# Patient Record
Sex: Male | Born: 1975 | Race: Black or African American | Hispanic: No | Marital: Married | Smoking: Never smoker
Health system: Southern US, Community
[De-identification: ages and names within clinical notes are randomized; demographics above are authoritative.]

## PROBLEM LIST (undated history)

## (undated) DIAGNOSIS — D500 Iron deficiency anemia secondary to blood loss (chronic): Secondary | ICD-10-CM

## (undated) DIAGNOSIS — K5190 Ulcerative colitis, unspecified, without complications: Secondary | ICD-10-CM

## (undated) DIAGNOSIS — J45909 Unspecified asthma, uncomplicated: Secondary | ICD-10-CM

## (undated) DIAGNOSIS — K625 Hemorrhage of anus and rectum: Secondary | ICD-10-CM

## (undated) HISTORY — DX: Unspecified asthma, uncomplicated: J45.909

## (undated) HISTORY — DX: Iron deficiency anemia secondary to blood loss (chronic): D50.0

## (undated) HISTORY — DX: Ulcerative colitis, unspecified, without complications: K51.90

## (undated) HISTORY — DX: Hemorrhage of anus and rectum: K62.5

---

## 1998-10-23 ENCOUNTER — Ambulatory Visit (HOSPITAL_COMMUNITY): Admission: RE | Admit: 1998-10-23 | Discharge: 1998-10-23 | Payer: Self-pay | Admitting: *Deleted

## 2004-01-23 ENCOUNTER — Emergency Department (HOSPITAL_COMMUNITY): Admission: EM | Admit: 2004-01-23 | Discharge: 2004-01-23 | Payer: Self-pay | Admitting: Emergency Medicine

## 2004-06-12 ENCOUNTER — Emergency Department (HOSPITAL_COMMUNITY): Admission: EM | Admit: 2004-06-12 | Discharge: 2004-06-13 | Payer: Self-pay | Admitting: Emergency Medicine

## 2006-02-13 ENCOUNTER — Emergency Department (HOSPITAL_COMMUNITY): Admission: EM | Admit: 2006-02-13 | Discharge: 2006-02-13 | Payer: Self-pay | Admitting: Emergency Medicine

## 2008-06-01 ENCOUNTER — Emergency Department (HOSPITAL_COMMUNITY): Admission: EM | Admit: 2008-06-01 | Discharge: 2008-06-01 | Payer: Self-pay | Admitting: Family Medicine

## 2008-06-02 ENCOUNTER — Emergency Department (HOSPITAL_COMMUNITY): Admission: EM | Admit: 2008-06-02 | Discharge: 2008-06-02 | Payer: Self-pay | Admitting: *Deleted

## 2009-09-05 HISTORY — PX: CHOLECYSTECTOMY: SHX55

## 2010-03-04 ENCOUNTER — Emergency Department (HOSPITAL_COMMUNITY): Admission: EM | Admit: 2010-03-04 | Discharge: 2010-03-04 | Payer: Self-pay | Admitting: Family Medicine

## 2010-03-04 ENCOUNTER — Emergency Department (HOSPITAL_COMMUNITY): Admission: EM | Admit: 2010-03-04 | Discharge: 2010-03-04 | Payer: Self-pay | Admitting: Emergency Medicine

## 2010-11-21 LAB — BASIC METABOLIC PANEL WITH GFR
BUN: 13 mg/dL (ref 6–23)
Calcium: 9 mg/dL (ref 8.4–10.5)
Creatinine, Ser: 1.25 mg/dL (ref 0.4–1.5)
Glucose, Bld: 94 mg/dL (ref 70–99)
Sodium: 139 meq/L (ref 135–145)

## 2010-11-21 LAB — DIFFERENTIAL
Basophils Absolute: 0 10*3/uL (ref 0.0–0.1)
Basophils Relative: 0 % (ref 0–1)
Eosinophils Absolute: 0.2 K/uL (ref 0.0–0.7)
Eosinophils Relative: 2 % (ref 0–5)
Lymphocytes Relative: 30 % (ref 12–46)
Lymphs Abs: 2.4 K/uL (ref 0.7–4.0)
Monocytes Absolute: 0.7 K/uL (ref 0.1–1.0)
Monocytes Relative: 9 % (ref 3–12)
Neutro Abs: 4.7 K/uL (ref 1.7–7.7)
Neutrophils Relative %: 59 % (ref 43–77)

## 2010-11-21 LAB — BASIC METABOLIC PANEL
CO2: 29 mEq/L (ref 19–32)
Chloride: 105 mEq/L (ref 96–112)
GFR calc Af Amer: >60 mL/min (ref >60)
GFR calc non Af Amer: >60 mL/min (ref >60)
Potassium: 4.4 mEq/L (ref 3.5–5.1)

## 2010-11-21 LAB — CBC
HCT: 39.3 % (ref 39.0–52.0)
Hemoglobin: 13.3 g/dL (ref 13.0–17.0)
MCH: 32.8 pg (ref 26.0–34.0)
MCHC: 33.9 g/dL (ref 30.0–36.0)
MCV: 96.8 fL (ref 78.0–100.0)
Platelets: 194 K/uL (ref 150–400)
RBC: 4.06 MIL/uL — ABNORMAL LOW (ref 4.22–5.81)
RDW: 12.6 % (ref 11.5–15.5)
WBC: 7.9 K/uL (ref 4.0–10.5)

## 2011-01-22 IMAGING — CT CT HEAD W/O CM
1 series · 16 of 30 positions shown, 20 images · non-contrast
Comparison: None.

CLINICAL DATA: 34-year-old male with confusion, syncope,
hallucinations, pain behind the right eye.

CT HEAD WITHOUT CONTRAST
TECHNIQUE: Contiguous axial images were obtained from the base of
the skull through the vertex without contrast.

[Series 2: head routine 4.8 h37s · axial · 0.44mm/px · z∈[-72,+59]mm · 16 of 30 slices shown, 20 images]
[im 2/30  brain]
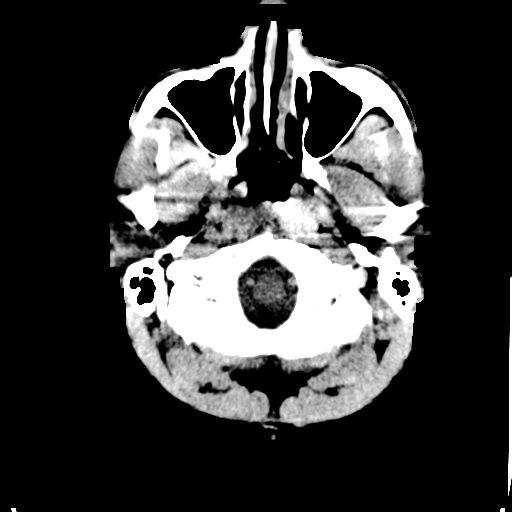
[im 2/30  bone]
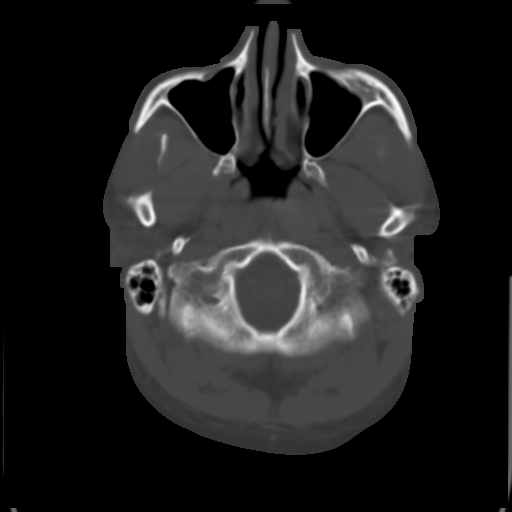
[im 4/30  brain]
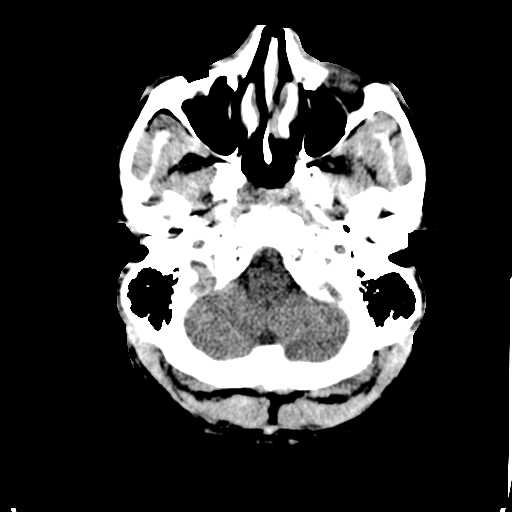
[im 6/30  brain]
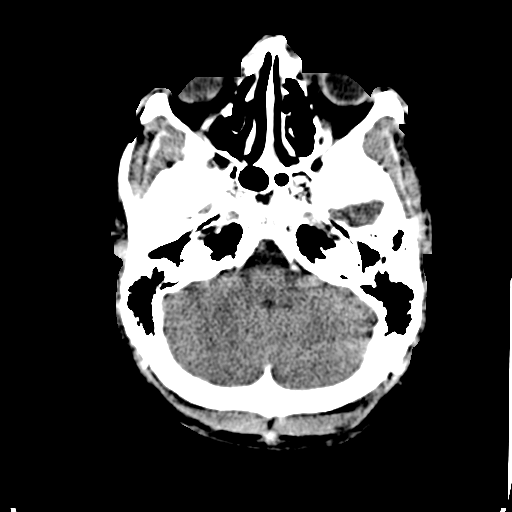
[im 8/30  brain]
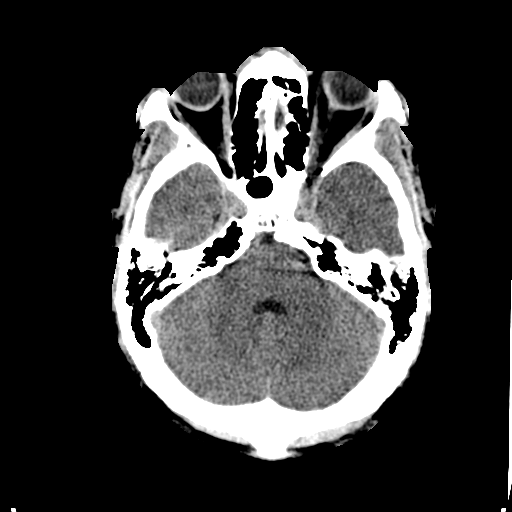
[im 9/30  brain]
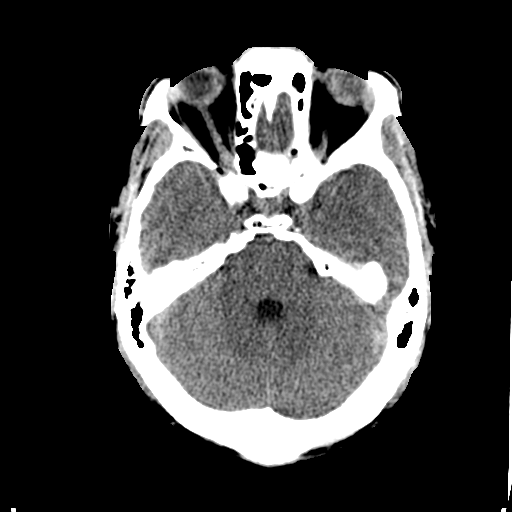
[im 9/30  bone]
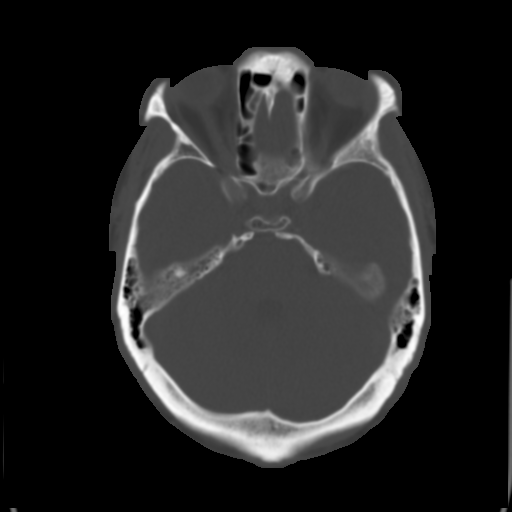
[im 11/30  brain]
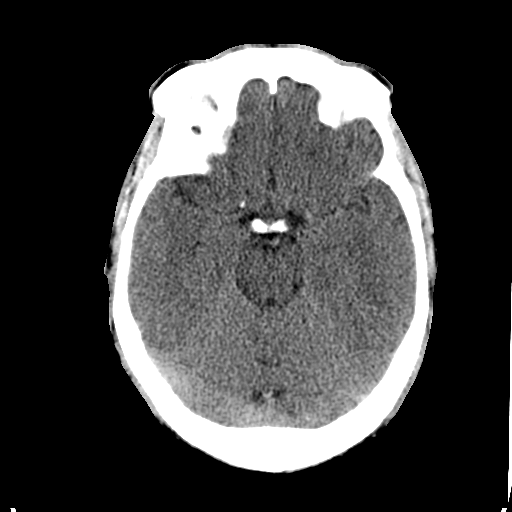
[im 13/30  brain]
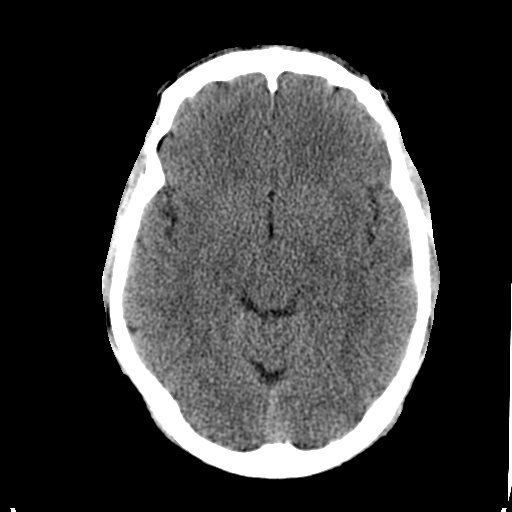
[im 15/30  brain]
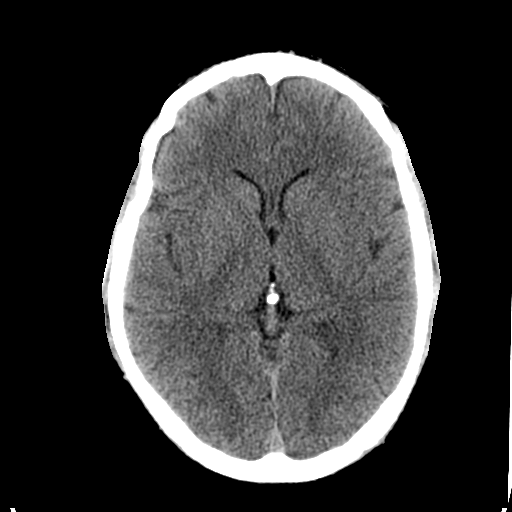
[im 16/30  brain]
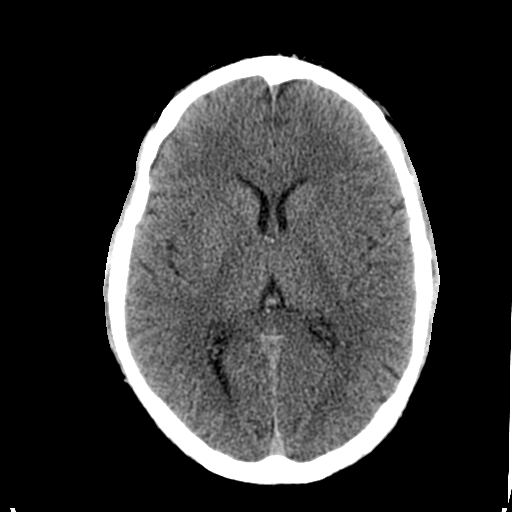
[im 16/30  bone]
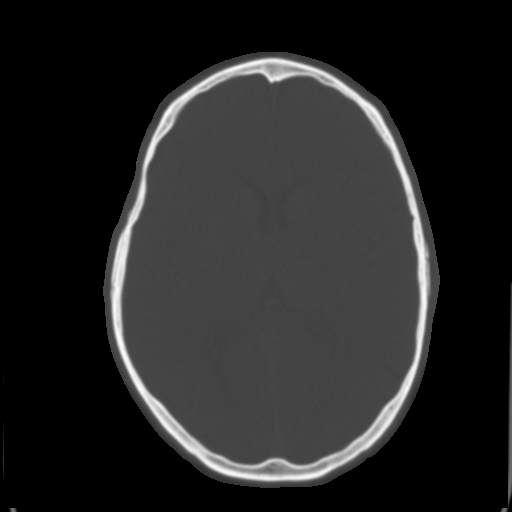
[im 18/30  brain]
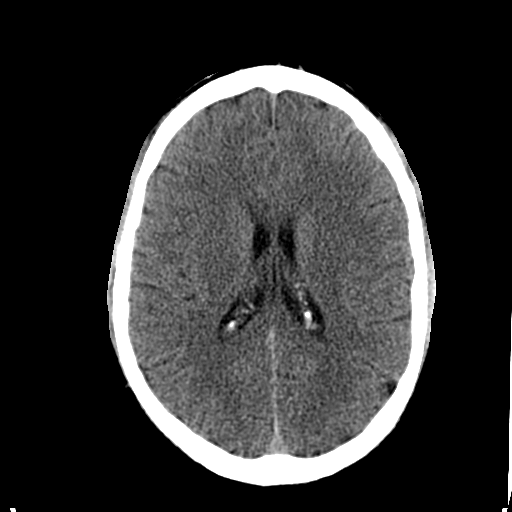
[im 20/30  brain]
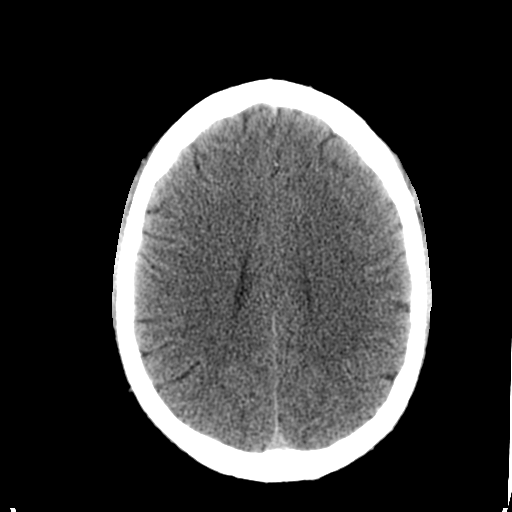
[im 22/30  brain]
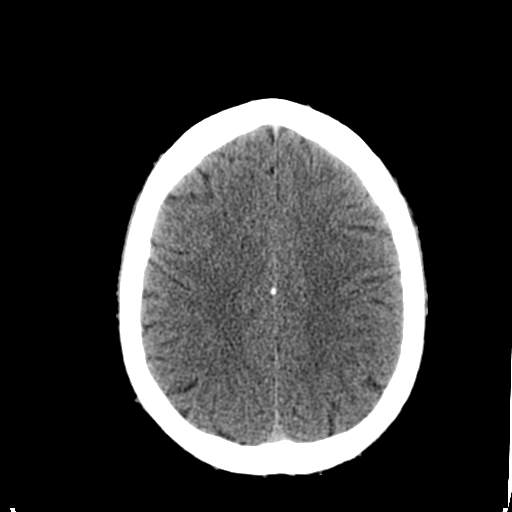
[im 23/30  brain]
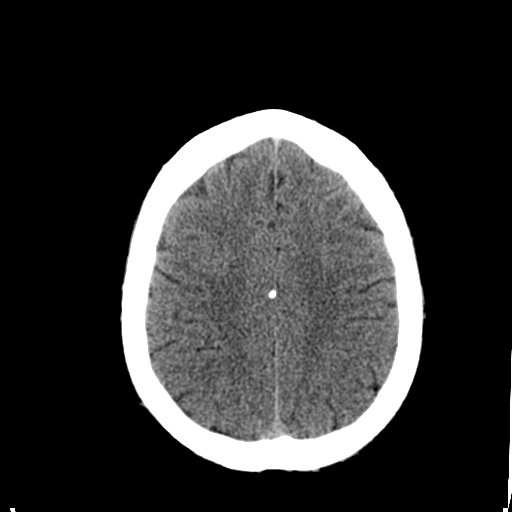
[im 23/30  bone]
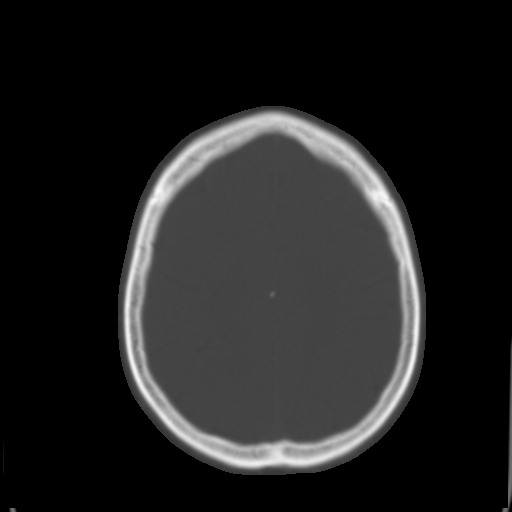
[im 25/30  brain]
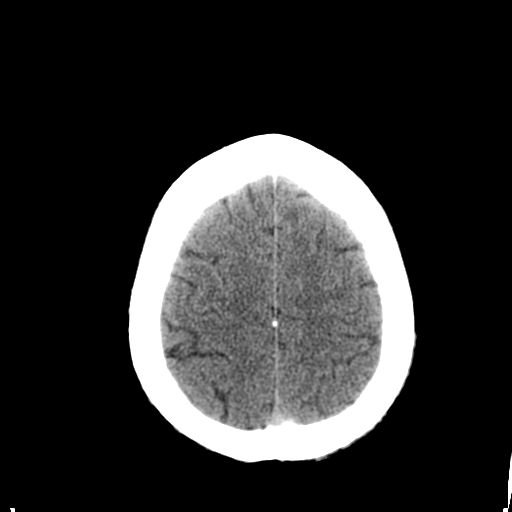
[im 27/30  brain]
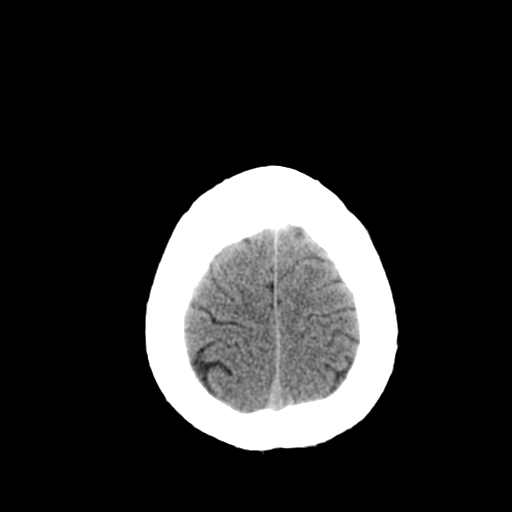
[im 29/30  brain]
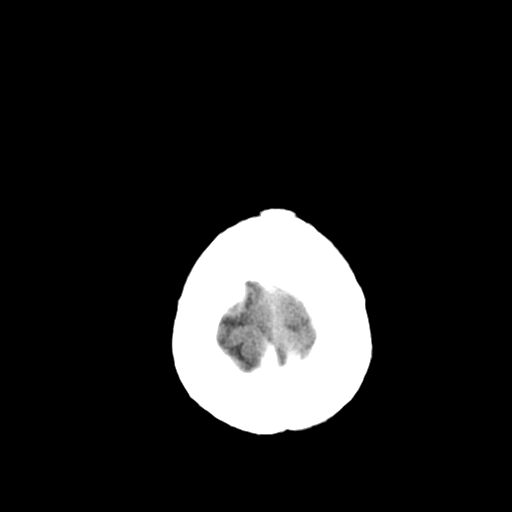

[16 of 30 positions shown; findings below may reference images not displayed]

FINDINGS: Visualized orbits and scalp soft tissues are within
normal limits.  Visualized paranasal sinuses and mastoids are
clear.  No acute osseous abnormality identified.

Cerebral volume is within normal limits for age.  No midline shift,
ventriculomegaly, mass effect, evidence of mass lesion,
intracranial hemorrhage or evidence of cortically based acute
infarction.  Gray-white matter differentiation is within normal
limits throughout the brain.  No suspicious intracranial vascular
hyperdensity.
IMPRESSION: Normal noncontrast CT appearance of the brain.

## 2011-06-06 LAB — DIFFERENTIAL
Eosinophils Absolute: 0.1
Eosinophils Relative: 1
Lymphocytes Relative: 13
Monocytes Absolute: 1.8 — ABNORMAL HIGH
Monocytes Relative: 10
Neutro Abs: 14 — ABNORMAL HIGH
Neutrophils Relative %: 77

## 2011-06-06 LAB — CBC
HCT: 42.9
Hemoglobin: 14.5
MCHC: 33.7
RDW: 12.6
WBC: 18.2 — ABNORMAL HIGH

## 2011-06-06 LAB — POCT I-STAT, CHEM 8
Calcium, Ion: 1.08 — ABNORMAL LOW
Creatinine, Ser: 1.7 — ABNORMAL HIGH
HCT: 44
Hemoglobin: 15

## 2011-06-06 LAB — POCT RAPID STREP A: Streptococcus, Group A Screen (Direct): POSITIVE — AB

## 2017-04-06 ENCOUNTER — Ambulatory Visit: Payer: Self-pay | Admitting: Adult Health

## 2017-04-27 ENCOUNTER — Ambulatory Visit: Payer: Self-pay | Admitting: Adult Health

## 2017-05-12 ENCOUNTER — Ambulatory Visit (INDEPENDENT_AMBULATORY_CARE_PROVIDER_SITE_OTHER): Payer: BC Managed Care – PPO | Admitting: Adult Health

## 2017-05-12 ENCOUNTER — Encounter: Payer: Self-pay | Admitting: Adult Health

## 2017-05-12 VITALS — BP 120/72 | Temp 98.5°F | Ht 69.0 in | Wt 201.0 lb

## 2017-05-12 DIAGNOSIS — Z23 Encounter for immunization: Secondary | ICD-10-CM | POA: Diagnosis not present

## 2017-05-12 DIAGNOSIS — Z125 Encounter for screening for malignant neoplasm of prostate: Secondary | ICD-10-CM

## 2017-05-12 DIAGNOSIS — Z0000 Encounter for general adult medical examination without abnormal findings: Secondary | ICD-10-CM | POA: Diagnosis not present

## 2017-05-12 LAB — HEPATIC FUNCTION PANEL
ALT: 14 U/L (ref 0–53)
AST: 14 U/L (ref 0–37)
Albumin: 3.9 g/dL (ref 3.5–5.2)
Alkaline Phosphatase: 120 U/L — ABNORMAL HIGH (ref 39–117)
BILIRUBIN TOTAL: 0.3 mg/dL (ref 0.2–1.2)
Bilirubin, Direct: 0.1 mg/dL (ref 0.0–0.3)
TOTAL PROTEIN: 7.8 g/dL (ref 6.0–8.3)

## 2017-05-12 LAB — BASIC METABOLIC PANEL
BUN: 17 mg/dL (ref 6–23)
CHLORIDE: 100 meq/L (ref 96–112)
CO2: 30 mEq/L (ref 19–32)
CREATININE: 1.18 mg/dL (ref 0.40–1.50)
Calcium: 8.9 mg/dL (ref 8.4–10.5)
GFR: 86.95 mL/min (ref >60.00)
Glucose, Bld: 87 mg/dL (ref 70–99)
POTASSIUM: 4.2 meq/L (ref 3.5–5.1)
SODIUM: 137 meq/L (ref 135–145)

## 2017-05-12 LAB — HEMOGLOBIN A1C: Hgb A1c MFr Bld: 5.8 % (ref 4.6–6.5)

## 2017-05-12 LAB — CBC WITH DIFFERENTIAL/PLATELET
BASOS PCT: 0.4 % (ref 0.0–3.0)
Basophils Absolute: 0 10*3/uL (ref 0.0–0.1)
EOS ABS: 0.2 10*3/uL (ref 0.0–0.7)
EOS PCT: 2.5 % (ref 0.0–5.0)
HEMATOCRIT: 38 % — AB (ref 39.0–52.0)
HEMOGLOBIN: 12.5 g/dL — AB (ref 13.0–17.0)
LYMPHS PCT: 36.4 % (ref 12.0–46.0)
Lymphs Abs: 2.4 10*3/uL (ref 0.7–4.0)
MCHC: 32.9 g/dL (ref 30.0–36.0)
MCV: 94.7 fl (ref 78.0–100.0)
MONOS PCT: 10.4 % (ref 3.0–12.0)
Monocytes Absolute: 0.7 10*3/uL (ref 0.1–1.0)
NEUTROS ABS: 3.3 10*3/uL (ref 1.4–7.7)
Neutrophils Relative %: 50.3 % (ref 43.0–77.0)
Platelets: 230 10*3/uL (ref 150.0–400.0)
RBC: 4.02 Mil/uL — ABNORMAL LOW (ref 4.22–5.81)
RDW: 13 % (ref 11.5–15.5)
WBC: 6.5 10*3/uL (ref 4.0–10.5)

## 2017-05-12 LAB — LIPID PANEL
CHOL/HDL RATIO: 4
CHOLESTEROL: 196 mg/dL (ref 0–200)
HDL: 48.1 mg/dL (ref >39.00)
LDL CALC: 138 mg/dL — AB (ref 0–99)
NonHDL: 147.62
Triglycerides: 49 mg/dL (ref 0.0–149.0)
VLDL: 9.8 mg/dL (ref 0.0–40.0)

## 2017-05-12 LAB — TSH: TSH: 0.96 u[IU]/mL (ref 0.35–4.50)

## 2017-05-12 LAB — PSA: PSA: 0.36 ng/mL (ref 0.10–4.00)

## 2017-05-12 NOTE — Progress Notes (Signed)
Patient presents to clinic today to establish care. He is a pleasant 41 year old male who  has a past medical history of Asthma.    Acute Concerns: Establish Care/CPE   Chronic Issues: None   Health Maintenance: Dental -- Routine Care Vision -- Not routine care  Immunizations -- Needs Tdap  Colonoscopy -- 2002 - for UC  Diet: " Not the best" He eats out a lot but not fast food.  Exercise: Does aerobic exercise twice per week   Past Medical History:  Diagnosis Date  . Asthma     Past Surgical History:  Procedure Laterality Date  . CHOLECYSTECTOMY  2011    No current outpatient prescriptions on file prior to visit.   No current facility-administered medications on file prior to visit.     No Known Allergies  Family History  Problem Relation Age of Onset  . Pancreatic cancer Mother   . Diabetes Mother   . Diabetes Maternal Grandmother   . Breast cancer Maternal Aunt     Social History   Social History  . Marital status: Single    Spouse name: N/A  . Number of children: N/A  . Years of education: N/A   Occupational History  . Not on file.   Social History Main Topics  . Smoking status: Never Smoker  . Smokeless tobacco: Never Used  . Alcohol use No  . Drug use: No  . Sexual activity: Not on file   Other Topics Concern  . Not on file   Social History Narrative  . No narrative on file    Review of Systems  Constitutional: Negative.   HENT: Negative.   Eyes: Negative.   Respiratory: Negative.   Cardiovascular: Negative.   Gastrointestinal: Negative.   Genitourinary: Negative.   Musculoskeletal: Negative.   Skin: Negative.   Neurological: Negative.   Endo/Heme/Allergies: Negative.   Psychiatric/Behavioral: Negative.   All other systems reviewed and are negative.   BP 120/72 (BP Location: Left Arm)   Temp 98.5 F (36.9 C) (Oral)   Ht  (1.753 m)   Wt 201 lb (91.2 kg)   BMI 29.68 kg/m   Physical Exam  Constitutional: He is  oriented to person, place, and time and well-developed, well-nourished, and in no distress. No distress.  HENT:  Head: Normocephalic and atraumatic.  Right Ear: External ear normal.  Left Ear: External ear normal.  Nose: Nose normal.  Mouth/Throat: Oropharynx is clear and moist. No oropharyngeal exudate.  Eyes: Pupils are equal, round, and reactive to light. Conjunctivae and EOM are normal. Right eye exhibits no discharge. Left eye exhibits no discharge. No scleral icterus.  Neck: Normal range of motion. Neck supple. No JVD present. No tracheal deviation present. No thyromegaly present.  Cardiovascular: Normal rate, regular rhythm, normal heart sounds and intact distal pulses.  Exam reveals no gallop and no friction rub.   No murmur heard. Pulmonary/Chest: Effort normal and breath sounds normal. No stridor. No respiratory distress. He has no wheezes. He has no rales. He exhibits no tenderness.  Abdominal: Soft. Bowel sounds are normal. He exhibits no distension and no mass. There is no tenderness. There is no rebound and no guarding.  Musculoskeletal: Normal range of motion. He exhibits no edema, tenderness or deformity.  Lymphadenopathy:    He has no cervical adenopathy.  Neurological: He is alert and oriented to person, place, and time. He displays normal reflexes. No cranial nerve deficit. He exhibits normal muscle tone. Coordination normal.  GCS score is 15.  Skin: Skin is warm and dry. No rash noted. He is not diaphoretic. No erythema. No pallor.  Psychiatric: Mood, memory, affect and judgment normal.  Nursing note and vitals reviewed.  Assessment/Plan: 1. Routine general medical examination at a health care facility - Flu and tdap given today  - Educated on the importance of diet and exercise.  - Follow up in one year or sooner if needed - Basic metabolic panel - CBC with Differential/Platelet - Hemoglobin A1c - Hepatic function panel - Lipid panel - PSA - TSH   Shirline Freesory Ashlyn Cabler,  NP

## 2017-05-12 NOTE — Patient Instructions (Signed)
It was great meeting you today   I will follow up with you about your blood work   Work on diet and increasing exercise to 3-4 times per week   Follow up in one year or sooner if needed  Health Maintenance, Male A healthy lifestyle and preventative care can promote health and wellness.  Maintain regular health, dental, and eye exams.  Eat a healthy diet. Foods like vegetables, fruits, whole grains, low-fat dairy products, and lean protein foods contain the nutrients you need and are low in calories. Decrease your intake of foods high in solid fats, added sugars, and salt. Get information about a proper diet from your health care provider, if necessary.  Regular physical exercise is one of the most important things you can do for your health. Most adults should get at least 150 minutes of moderate-intensity exercise (any activity that increases your heart rate and causes you to sweat) each week. In addition, most adults need muscle-strengthening exercises on 2 or more days a week.   Maintain a healthy weight. The body mass index (BMI) is a screening tool to identify possible weight problems. It provides an estimate of body fat based on height and weight. Your health care provider can find your BMI and can help you achieve or maintain a healthy weight. For males 20 years and older:  A BMI below 18.5 is considered underweight.  A BMI of 18.5 to 24.9 is normal.  A BMI of 25 to 29.9 is considered overweight.  A BMI of 30 and above is considered obese.  Maintain normal blood lipids and cholesterol by exercising and minimizing your intake of saturated fat. Eat a balanced diet with plenty of fruits and vegetables. Blood tests for lipids and cholesterol should begin at age 42 and be repeated every 5 years. If your lipid or cholesterol levels are high, you are over age 42, or you are at high risk for heart disease, you may need your cholesterol levels checked more frequently.Ongoing high lipid and  cholesterol levels should be treated with medicines if diet and exercise are not working.  If you smoke, find out from your health care provider how to quit. If you do not use tobacco, do not start.  Lung cancer screening is recommended for adults aged 55-80 years who are at high risk for developing lung cancer because of a history of smoking. A yearly low-dose CT scan of the lungs is recommended for people who have at least a 30-pack-year history of smoking and are current smokers or have quit within the past 15 years. A pack year of smoking is smoking an average of 1 pack of cigarettes a day for 1 year (for example, a 30-pack-year history of smoking could mean smoking 1 pack a day for 30 years or 2 packs a day for 15 years). Yearly screening should continue until the smoker has stopped smoking for at least 15 years. Yearly screening should be stopped for people who develop a health problem that would prevent them from having lung cancer treatment.  If you choose to drink alcohol, do not have more than 2 drinks per day. One drink is considered to be 12 oz (360 mL) of beer, 5 oz (150 mL) of wine, or 1.5 oz (45 mL) of liquor.  Avoid the use of street drugs. Do not share needles with anyone. Ask for help if you need support or instructions about stopping the use of drugs.  High blood pressure causes heart disease and increases  the risk of stroke. High blood pressure is more likely to develop in:  People who have blood pressure in the end of the normal range (100-139/85-89 mm Hg).  People who are overweight or obese.  People who are African American.  If you are 64-59 years of age, have your blood pressure checked every 3-5 years. If you are 80 years of age or older, have your blood pressure checked every year. You should have your blood pressure measured twice--once when you are at a hospital or clinic, and once when you are not at a hospital or clinic. Record the average of the two measurements. To  check your blood pressure when you are not at a hospital or clinic, you can use:  An automated blood pressure machine at a pharmacy.  A home blood pressure monitor.  If you are 81-56 years old, ask your health care provider if you should take aspirin to prevent heart disease.  Diabetes screening involves taking a blood sample to check your fasting blood sugar level. This should be done once every 3 years after age 38 if you are at a normal weight and without risk factors for diabetes. Testing should be considered at a younger age or be carried out more frequently if you are overweight and have at least 1 risk factor for diabetes.  Colorectal cancer can be detected and often prevented. Most routine colorectal cancer screening begins at the age of 38 and continues through age 57. However, your health care provider may recommend screening at an earlier age if you have risk factors for colon cancer. On a yearly basis, your health care provider may provide home test kits to check for hidden blood in the stool. A small camera at the end of a tube may be used to directly examine the colon (sigmoidoscopy or colonoscopy) to detect the earliest forms of colorectal cancer. Talk to your health care provider about this at age 24 when routine screening begins. A direct exam of the colon should be repeated every 5-10 years through age 106, unless early forms of precancerous polyps or small growths are found.  People who are at an increased risk for hepatitis B should be screened for this virus. You are considered at high risk for hepatitis B if:  You were born in a country where hepatitis B occurs often. Talk with your health care provider about which countries are considered high risk.  Your parents were born in a high-risk country and you have not received a shot to protect against hepatitis B (hepatitis B vaccine).  You have HIV or AIDS.  You use needles to inject street drugs.  You live with, or have sex  with, someone who has hepatitis B.  You are a man who has sex with other men (MSM).  You get hemodialysis treatment.  You take certain medicines for conditions like cancer, organ transplantation, and autoimmune conditions.  Hepatitis C blood testing is recommended for all people born from [birth year] through 1966 and any individual with known risk factors for hepatitis C.  Healthy men should no longer receive prostate-specific antigen (PSA) blood tests as part of routine cancer screening. Talk to your health care provider about prostate cancer screening.  Testicular cancer screening is not recommended for adolescents or adult males who have no symptoms. Screening includes self-exam, a health care provider exam, and other screening tests. Consult with your health care provider about any symptoms you have or any concerns you have about testicular cancer.  Practice  safe sex. Use condoms and avoid high-risk sexual practices to reduce the spread of sexually transmitted infections (STIs).  You should be screened for STIs, including gonorrhea and chlamydia if:  You are sexually active and are younger than 24 years.  You are older than 24 years, and your health care provider tells you that you are at risk for this type of infection.  Your sexual activity has changed since you were last screened, and you are at an increased risk for chlamydia or gonorrhea. Ask your health care provider if you are at risk.  If you are at risk of being infected with HIV, it is recommended that you take a prescription medicine daily to prevent HIV infection. This is called pre-exposure prophylaxis (PrEP). You are considered at risk if:  You are a man who has sex with other men (MSM).  You are a heterosexual man who is sexually active with multiple partners.  You take drugs by injection.  You are sexually active with a partner who has HIV.  Talk with your health care provider about whether you are at high risk of being  infected with HIV. If you choose to begin PrEP, you should first be tested for HIV. You should then be tested every 3 months for as long as you are taking PrEP.  Use sunscreen. Apply sunscreen liberally and repeatedly throughout the day. You should seek shade when your shadow is shorter than you. Protect yourself by wearing long sleeves, pants, a wide-brimmed hat, and sunglasses year round whenever you are outdoors.  Tell your health care provider of new moles or changes in moles, especially if there is a change in shape or color. Also, tell your health care provider if a mole is larger than the size of a pencil eraser.  A one-time screening for abdominal aortic aneurysm (AAA) and surgical repair of large AAAs by ultrasound is recommended for men aged 60-75 years who are current or former smokers.  Stay current with your vaccines (immunizations).   This information is not intended to replace advice given to you by your health care provider. Make sure you discuss any questions you have with your health care provider.   Document Released: 02/18/2008 Document Revised: 09/12/2014 Document Reviewed: 01/17/2011 Elsevier Interactive Patient Education [employer].

## 2017-05-12 NOTE — Addendum Note (Signed)
Addended by: Raj JanusADKINS, MISTY T on: 05/12/2017 08:41 AM   Modules accepted: Orders

## 2017-05-26 ENCOUNTER — Encounter: Payer: Self-pay | Admitting: Adult Health

## 2017-06-07 ENCOUNTER — Other Ambulatory Visit: Payer: Self-pay | Admitting: Adult Health

## 2017-06-07 DIAGNOSIS — K5120 Ulcerative (chronic) proctitis without complications: Secondary | ICD-10-CM

## 2017-08-09 ENCOUNTER — Encounter: Payer: Self-pay | Admitting: Adult Health

## 2021-02-17 ENCOUNTER — Other Ambulatory Visit: Payer: Self-pay

## 2021-02-17 ENCOUNTER — Encounter: Payer: Self-pay | Admitting: Gastroenterology

## 2021-02-17 ENCOUNTER — Ambulatory Visit (INDEPENDENT_AMBULATORY_CARE_PROVIDER_SITE_OTHER): Payer: BC Managed Care – PPO | Admitting: Gastroenterology

## 2021-02-17 VITALS — BP 138/83 | HR 83 | Temp 98.2°F | Ht 70.0 in | Wt 196.0 lb

## 2021-02-17 DIAGNOSIS — R194 Change in bowel habit: Secondary | ICD-10-CM | POA: Diagnosis not present

## 2021-02-17 DIAGNOSIS — K625 Hemorrhage of anus and rectum: Secondary | ICD-10-CM | POA: Diagnosis not present

## 2021-02-17 NOTE — Patient Instructions (Signed)
High-Fiber Eating Plan Fiber, also called dietary fiber, is a type of carbohydrate. It is found foods such as fruits, vegetables, whole grains, and beans. A high-fiber diet can have many health benefits. Your health care provider may recommend a high-fiber diet to help: Prevent constipation. Fiber can make your bowel movements more regular. Lower your cholesterol. Relieve the following conditions: Inflammation of veins in the anus (hemorrhoids). Inflammation of specific areas of the digestive tract (uncomplicated diverticulosis). A problem of the large intestine, also called the colon, that sometimes causes pain and diarrhea (irritable bowel syndrome, or IBS). Prevent overeating as part of a weight-loss plan. Prevent heart disease, type 2 diabetes, and certain cancers. What are tips for following this plan? Reading food labels  Check the nutrition facts label on food products for the amount of dietary fiber. Choose foods that have 5 grams of fiber or more per serving. The goals for recommended daily fiber intake include: Men (age 46 or younger): 34-38 g. Men (over age 47): 28-34 g. Women (age 12 or younger): 25-28 g. Women (over age 81): 22-25 g. Your daily fiber goal is _____________ g. Shopping Choose whole fruits and vegetables instead of processed forms, such as apple juice or applesauce. Choose a wide variety of high-fiber foods such as avocados, lentils, oats, and kidney beans. Read the nutrition facts label of the foods you choose. Be aware of foods with added fiber. These foods often have high sugar and sodium amounts per serving. Cooking Use whole-grain flour for baking and cooking. Cook with brown rice instead of white rice. Meal planning Start the day with a breakfast that is high in fiber, such as a cereal that contains 5 g of fiber or more per serving. Eat breads and cereals that are made with whole-grain flour instead of refined flour or white flour. Eat brown rice, bulgur  wheat, or millet instead of white rice. Use beans in place of meat in soups, salads, and pasta dishes. Be sure that half of the grains you eat each day are whole grains. General information You can get the recommended daily intake of dietary fiber by: Eating a variety of fruits, vegetables, grains, nuts, and beans. Taking a fiber supplement if you are not able to take in enough fiber in your diet. It is better to get fiber through food than from a supplement. Gradually increase how much fiber you consume. If you increase your intake of dietary fiber too quickly, you may have bloating, cramping, or gas. Drink plenty of water to help you digest fiber. Choose high-fiber snacks, such as berries, raw vegetables, nuts, and popcorn. What foods should I eat? Fruits Berries. Pears. Apples. Oranges. Avocado. Prunes and raisins. Dried figs. Vegetables Sweet potatoes. Spinach. Kale. Artichokes. Cabbage. Broccoli. Cauliflower.Green peas. Carrots. Squash. Grains Whole-grain breads. Multigrain cereal. Oats and oatmeal. Brown rice. Barley.Bulgur wheat. Moonshine. Quinoa. Bran muffins. Popcorn. Rye wafer crackers. Meats and other proteins Navy beans, kidney beans, and pinto beans. Soybeans. Split peas. Lentils. Nutsand seeds. Dairy Fiber-fortified yogurt. Beverages Fiber-fortified soy milk. Fiber-fortified orange juice. Other foods Fiber bars. The items listed above may not be a complete list of recommended foods and beverages. Contact a dietitian for more information. What foods should I avoid? Fruits Fruit juice. Cooked, strained fruit. Vegetables Fried potatoes. Canned vegetables. Well-cooked vegetables. Grains White bread. Pasta made with refined flour. White rice. Meats and other proteins Fatty cuts of meat. Fried chicken or fried fish. Dairy Milk. Yogurt. Cream cheese. Sour cream. Fats and oils Butters. Beverages  Soft drinks. Other foods Cakes and pastries. The items listed above may not  be a complete list of foods and beverages to avoid. Talk with your dietitian about what choices are best for you. Summary Fiber is a type of carbohydrate. It is found in foods such as fruits, vegetables, whole grains, and beans. A high-fiber diet has many benefits. It can help to prevent constipation, lower blood cholesterol, aid weight loss, and reduce your risk of heart disease, diabetes, and certain cancers. Increase your intake of fiber gradually. Increasing fiber too quickly may cause cramping, bloating, and gas. Drink plenty of water while you increase the amount of fiber you consume. The best sources of fiber include whole fruits and vegetables, whole grains, nuts, seeds, and beans. This information is not intended to replace advice given to you by your health care provider. Make sure you discuss any questions you have with your healthcare provider. Document Revised: 12/26/2019 Document Reviewed: 12/26/2019 Elsevier Patient Education  2022 Elsevier Inc.     RE: MyChart  Dear Mr. Isam Unrein makes it easy for you to view your health information - all in one secure location - from any computer or mobile device at any time. Use the activation code below to enroll in MyChart online at [email or web address]   Once your account is activated, you can:  View your test results. Communicate securely with your physician's office.  View your medical history, allergies, medications, and immunizations. Receive care virtually through an e-Visit.   If you are over 18, you may use features of MyChart to manage the health information of your spouse, children or others you care for.  Download child and adult access forms at [email or web address].    As you activate your MyChart account and need any technical assistance, please call the MyChart technical support line at [phone]) [phone]-CHART [phone]).  Be sure to also download the MyChart app for your mobile device.   Thank you for  choosing [facility] for your family's health care needs!   MyChart Activation Code:  Activation code not generated Current MyChart Status: Active             [facility]  [address] [place], [place] [address]

## 2021-02-17 NOTE — Progress Notes (Signed)
Ian Repress, MD [address]  [address]  [place], [place] [address]  Main: [phone]  Fax: [phone]    Gastroenterology Consultation  Referring Provider:     Shirline Frees, NP Primary Care Physician:  Ian Frees, NP Primary Gastroenterologist:  Dr. Arlyss Washington Reason for Consultation:     ?  Ulcerative colitis        HPI:   Ian Washington is a 45 y.o. male referred by Dr. Shirline Frees, NP  for consultation & management of approximately 3 months history of rectal bleeding associated with alternating episodes of constipation and diarrhea, urgency.  Patient reports that he was diagnosed with ulcerative colitis in [place] after he had a colonoscopy about 20 years ago.  Patient is not on any maintenance therapy for his ulcerative colitis.  He is from [place], [place], he reports that he underwent colonoscopy about 10 years ago and was reportedly normal.  He had a flareup of his GI symptoms recently for which he was given a steroid injection and a short course of prednisone which provided some relief.  He made an appointment to come here since he could not wait for 3 more months to see GI doctor in [place].  Patient works in a Programme researcher, broadcasting/film/video.  He does exercise daily.  He does not smoke tobacco.  Does drink alcohol occasionally.  No known history of anemia  NSAIDs: None  Antiplts/Anticoagulants/Anti thrombotics: None  GI Procedures: Colonoscopy over 10 years ago, reportedly normal, report not available Patient denies family history of GI malignancy, IBD  Past Medical History:  Diagnosis Date   Asthma    UC (ulcerative colitis) (HCC)      Current Outpatient Medications:    sildenafil (VIAGRA) 50 MG tablet, Take 50 mg by mouth daily as needed., Disp: , Rfl:     Family History  Problem Relation Age of Onset   Pancreatic cancer Mother    Diabetes Mother    Diabetes Maternal Grandmother    Breast cancer Maternal Aunt      Social History    Tobacco Use   Smoking status: Never   Smokeless tobacco: Never  Vaping Use   Vaping Use: Never used  Substance Use Topics   Alcohol use: No   Drug use: No    Allergies as of 02/17/2021   (No Known Allergies)    Review of Systems:    All systems reviewed and negative except where noted in HPI.   Physical Exam:  BP 138/83 (BP Location: Left Arm, Patient Position: Sitting, Cuff Size: Normal)   Pulse 83   Temp 98.2 F (36.8 C) (Oral)   Ht 5\' 10"  (1.778 m)   Wt 196 lb (88.9 kg)   BMI 28.12 kg/m  No LMP for male patient.  General:   Alert,  Well-developed, well-nourished, pleasant and cooperative in NAD Head:  Normocephalic and atraumatic. Eyes:  Sclera clear, no icterus.   Conjunctiva pink. Ears:  Normal auditory acuity. Nose:  No deformity, discharge, or lesions. Mouth:  No deformity or lesions,oropharynx pink & moist. Neck:  Supple; no masses or thyromegaly. Lungs:  Respirations even and unlabored.  Clear throughout to auscultation.   No wheezes, crackles, or rhonchi. No acute distress. Heart:  Regular rate and rhythm; no murmurs, clicks, rubs, or gallops. Abdomen:  Normal bowel sounds. Soft, non-tender and non-distended without masses, hepatosplenomegaly or hernias noted.  No guarding or rebound tenderness.   Rectal: Not performed Msk:  Symmetrical without gross  deformities. Good, equal movement & strength bilaterally. Pulses:  Normal pulses noted. Extremities:  No clubbing or edema.  No cyanosis. Neurologic:  Alert and oriented x3;  grossly normal neurologically. Skin:  Intact without significant lesions or rashes. No jaundice. Psych:  Alert and cooperative. Normal mood and affect.  Imaging Studies: No recent abdominal imaging  Assessment and Plan:   Ian Washington is a 45 y.o. pleasant African-American male with ?  Ulcerative colitis is referred for further evaluation of intermittent rectal bleeding, urgency as well as intermittent constipation  Recommend  flexible sigmoidoscopy Discussed about high-fiber diet, adequate intake of water MiraLAX as needed to have regular bowel movements Check CBC, CMP today  Follow up in 3 months   Ian Repress, MD

## 2021-02-18 ENCOUNTER — Encounter: Payer: Self-pay | Admitting: Gastroenterology

## 2021-02-18 LAB — COMPREHENSIVE METABOLIC PANEL
ALT: 11 IU/L (ref 0–44)
AST: 15 IU/L (ref 0–40)
Albumin/Globulin Ratio: 1.3 (ref 1.2–2.2)
Albumin: 4.2 g/dL (ref 4.0–5.0)
Alkaline Phosphatase: 125 IU/L — ABNORMAL HIGH (ref 44–121)
BUN/Creatinine Ratio: 12 (ref 9–20)
BUN: 14 mg/dL (ref 6–24)
Bilirubin Total: 0.6 mg/dL (ref 0.0–1.2)
CO2: 24 mmol/L (ref 20–29)
Calcium: 9 mg/dL (ref 8.7–10.2)
Chloride: 104 mmol/L (ref 96–106)
Creatinine, Ser: 1.18 mg/dL (ref 0.76–1.27)
Globulin, Total: 3.3 g/dL (ref 1.5–4.5)
Glucose: 79 mg/dL (ref 65–99)
Potassium: 4.2 mmol/L (ref 3.5–5.2)
Sodium: 143 mmol/L (ref 134–144)
Total Protein: 7.5 g/dL (ref 6.0–8.5)
eGFR: 77 mL/min/{1.73_m2} (ref >59)

## 2021-02-18 LAB — CBC
Hematocrit: 36.4 % — ABNORMAL LOW (ref 37.5–51.0)
Hemoglobin: 12.3 g/dL — ABNORMAL LOW (ref 13.0–17.7)
MCH: 30.9 pg (ref 26.6–33.0)
MCHC: 33.8 g/dL (ref 31.5–35.7)
MCV: 92 fL (ref 79–97)
Platelets: 230 10*3/uL (ref 150–450)
RBC: 3.98 x10E6/uL — ABNORMAL LOW (ref 4.14–5.80)
RDW: 12.2 % (ref 11.6–15.4)
WBC: 7.4 10*3/uL (ref 3.4–10.8)

## 2021-02-22 ENCOUNTER — Telehealth: Payer: Self-pay

## 2021-02-22 DIAGNOSIS — D649 Anemia, unspecified: Secondary | ICD-10-CM

## 2021-02-22 DIAGNOSIS — R748 Abnormal levels of other serum enzymes: Secondary | ICD-10-CM

## 2021-02-22 NOTE — Telephone Encounter (Signed)
Patient verbalized understanding of lab results. Patient states he will go get labs done in [place] where he lives

## 2021-02-22 NOTE — Telephone Encounter (Signed)
-----   Message from Toney Reil, MD sent at 02/19/2021  3:51 PM EDT ----- Labs revealed mild anemia, recommend iron panel, B12 and folate levels.  Has mildly elevated alkaline phosphatase levels, recommend alkaline phosphatase isoenzymes and GGT  RV

## 2021-02-24 ENCOUNTER — Telehealth: Payer: Self-pay

## 2021-02-24 MED ORDER — FUSION PLUS PO CAPS: 1.0000 | ORAL_CAPSULE | Freq: Every day | ORAL | 0 refills | Status: AC

## 2021-02-24 MED ORDER — NA SULFATE-K SULFATE-MG SULF 17.5-3.13-1.6 GM/177ML PO SOLN: 354.0000 mL | Freq: Once | ORAL | 0 refills | Status: DC

## 2021-02-24 NOTE — Telephone Encounter (Signed)
Patient verbalized understanding. He is okay with changing the procedure to a colonoscopy and EGD. Called Trish and informed her of the change. Sent instructions to mychart and pre to the pharmacy

## 2021-02-24 NOTE — Telephone Encounter (Signed)
-----   Message from Toney Reil, MD sent at 02/24/2021  9:16 AM EDT ----- Morrie Sheldon  Patient has severe iron deficiency anemia.  Instead of flexible sigmoidoscopy, I recommend upper endoscopy as well as colonoscopy given his history of ulcerative colitis and to identify any source of iron deficiency anemia.  If he is agreeable, please go ahead and schedule EGD and colonoscopy  Also, I recommend trial of fusion plus daily for 1 month  Rohini Vanga

## 2021-02-25 LAB — IRON,TIBC AND FERRITIN PANEL
Ferritin: 19 ng/mL — ABNORMAL LOW (ref 30–400)
Iron Saturation: 11 % — ABNORMAL LOW (ref 15–55)
Iron: 34 ug/dL — ABNORMAL LOW (ref 38–169)
Total Iron Binding Capacity: 310 ug/dL (ref 250–450)
UIBC: 276 ug/dL (ref 111–343)

## 2021-02-25 LAB — B12 AND FOLATE PANEL
Folate: 7.3 ng/mL (ref >3.0)
Vitamin B-12: 712 pg/mL (ref 232–1245)

## 2021-02-25 LAB — ALKALINE PHOSPHATASE, ISOENZYMES
Alkaline Phosphatase: 130 IU/L — ABNORMAL HIGH (ref 44–121)
BONE FRACTION: 40 % (ref 12–68)
INTESTINAL FRAC.: 11 % (ref 0–18)
LIVER FRACTION: 49 % (ref 13–88)

## 2021-02-25 LAB — GAMMA GT: GGT: 31 IU/L (ref 0–65)

## 2021-03-01 ENCOUNTER — Telehealth: Payer: Self-pay

## 2021-03-01 NOTE — Telephone Encounter (Signed)
Patient called and states he is going to be out of town for work on 03/15/2021. Moved procedure to 03/18/2021. Informed Trish of the change

## 2021-03-15 ENCOUNTER — Other Ambulatory Visit: Payer: Self-pay

## 2021-03-15 MED ORDER — NA SULFATE-K SULFATE-MG SULF 17.5-3.13-1.6 GM/177ML PO SOLN: 354.0000 mL | Freq: Once | ORAL | 0 refills | Status: AC

## 2021-03-15 NOTE — Progress Notes (Signed)
Patient would like to prep sent to [facility] in [place]

## 2021-03-17 ENCOUNTER — Encounter: Payer: Self-pay | Admitting: Gastroenterology

## 2021-03-18 ENCOUNTER — Ambulatory Visit
Admission: RE | Admit: 2021-03-18 | Discharge: 2021-03-18 | Disposition: A | Discharge status: Home / Self Care | Payer: 59 | Attending: Gastroenterology | Admitting: Gastroenterology

## 2021-03-18 ENCOUNTER — Other Ambulatory Visit: Payer: Self-pay

## 2021-03-18 ENCOUNTER — Encounter: Payer: Self-pay | Admitting: Gastroenterology

## 2021-03-18 ENCOUNTER — Telehealth: Payer: Self-pay

## 2021-03-18 ENCOUNTER — Ambulatory Visit: Payer: 59 | Admitting: Anesthesiology

## 2021-03-18 ENCOUNTER — Other Ambulatory Visit: Payer: Self-pay | Admitting: Gastroenterology

## 2021-03-18 ENCOUNTER — Encounter
Admission: RE | Disposition: A | Discharge status: Home / Self Care | Payer: Self-pay | Source: Home / Self Care | Attending: Gastroenterology

## 2021-03-18 DIAGNOSIS — D500 Iron deficiency anemia secondary to blood loss (chronic): Secondary | ICD-10-CM

## 2021-03-18 DIAGNOSIS — K2950 Unspecified chronic gastritis without bleeding: Secondary | ICD-10-CM | POA: Diagnosis not present

## 2021-03-18 DIAGNOSIS — D509 Iron deficiency anemia, unspecified: Secondary | ICD-10-CM | POA: Diagnosis not present

## 2021-03-18 DIAGNOSIS — K51011 Ulcerative (chronic) pancolitis with rectal bleeding: Secondary | ICD-10-CM

## 2021-03-18 DIAGNOSIS — R194 Change in bowel habit: Secondary | ICD-10-CM | POA: Diagnosis not present

## 2021-03-18 DIAGNOSIS — K921 Melena: Secondary | ICD-10-CM | POA: Insufficient documentation

## 2021-03-18 DIAGNOSIS — K625 Hemorrhage of anus and rectum: Secondary | ICD-10-CM | POA: Diagnosis not present

## 2021-03-18 DIAGNOSIS — K5100 Ulcerative (chronic) pancolitis without complications: Secondary | ICD-10-CM

## 2021-03-18 HISTORY — PX: ESOPHAGOGASTRODUODENOSCOPY (EGD) WITH PROPOFOL: SHX5813

## 2021-03-18 HISTORY — PX: COLONOSCOPY WITH PROPOFOL: SHX5780

## 2021-03-18 SURGERY — COLONOSCOPY WITH PROPOFOL
Anesthesia: General

## 2021-03-18 MED ORDER — PREDNISONE 10 MG PO TABS: ORAL_TABLET | ORAL | 0 refills | Status: DC

## 2021-03-18 MED ORDER — SODIUM CHLORIDE 0.9 % IV SOLN
INTRAVENOUS | Status: DC
Start: 2021-03-18 — End: 2021-03-18

## 2021-03-18 MED ORDER — PROPOFOL 500 MG/50ML IV EMUL
INTRAVENOUS | Status: DC | PRN
  Administered 2021-03-18: 100 ug/kg/min via INTRAVENOUS

## 2021-03-18 MED ORDER — MIDAZOLAM HCL 2 MG/2ML IJ SOLN
INTRAMUSCULAR | Status: AC
  Filled 2021-03-18: qty 2

## 2021-03-18 MED ORDER — PROPOFOL 10 MG/ML IV BOLUS
INTRAVENOUS | Status: DC | PRN
  Administered 2021-03-18: 50 mg via INTRAVENOUS

## 2021-03-18 MED ORDER — MIDAZOLAM HCL 2 MG/2ML IJ SOLN
INTRAMUSCULAR | Status: DC | PRN
  Administered 2021-03-18: 2 mg via INTRAVENOUS

## 2021-03-18 MED ORDER — PREDNISONE 10 MG PO TABS: ORAL_TABLET | ORAL | 0 refills | Status: AC

## 2021-03-18 MED ORDER — FENTANYL CITRATE (PF) 100 MCG/2ML IJ SOLN
INTRAMUSCULAR | Status: AC
  Filled 2021-03-18: qty 2

## 2021-03-18 MED ORDER — FENTANYL CITRATE (PF) 100 MCG/2ML IJ SOLN
INTRAMUSCULAR | Status: DC | PRN
  Administered 2021-03-18: 25 ug via INTRAVENOUS
  Administered 2021-03-18: 50 ug via INTRAVENOUS
  Administered 2021-03-18: 25 ug via INTRAVENOUS

## 2021-03-18 NOTE — Telephone Encounter (Signed)
My mistake, please redo the prescription  RV

## 2021-03-18 NOTE — Anesthesia Preprocedure Evaluation (Signed)
Anesthesia Evaluation  Patient identified by MRN, date of birth, ID band Patient awake    Reviewed: Allergy & Precautions, NPO status , Patient's Chart, lab work & pertinent test results  Airway Mallampati: II  TM Distance: >3 FB Neck ROM: Full    Dental no notable dental hx.    Pulmonary asthma ,    Pulmonary exam normal        Cardiovascular negative cardio ROS Normal cardiovascular exam     Neuro/Psych negative neurological ROS  negative psych ROS   GI/Hepatic Neg liver ROS, PUD, Bowel prep,Ulcerative Colitis   Endo/Other  negative endocrine ROS  Renal/GU negative Renal ROS  negative genitourinary   Musculoskeletal negative musculoskeletal ROS (+)   Abdominal   Peds negative pediatric ROS (+)  Hematology negative hematology ROS (+) anemia ,   Anesthesia Other Findings Rectal Bleed  Reproductive/Obstetrics negative OB ROS                            Anesthesia Physical Anesthesia Plan  ASA: 2  Anesthesia Plan: General   Post-op Pain Management:    Induction: Intravenous  PONV Risk Score and Plan: 2 and Propofol infusion and TIVA  Airway Management Planned: Natural Airway and Nasal Cannula  Additional Equipment:   Intra-op Plan:   Post-operative Plan:   Informed Consent: I have reviewed the patients History and Physical, chart, labs and discussed the procedure including the risks, benefits and alternatives for the proposed anesthesia with the patient or authorized representative who has indicated his/her understanding and acceptance.       Plan Discussed with: CRNA, Anesthesiologist and Surgeon  Anesthesia Plan Comments:         Anesthesia Quick Evaluation

## 2021-03-18 NOTE — Op Note (Signed)
[facility] Gastroenterology Patient Name: Ian Washington Procedure Date: 03/18/2021 8:46 AM MRN: [id number] Account #: [id number] Date of Birth: 1975/10/14 Admit Type: Outpatient Age: [age] Room[facility] ENDO ROOM 4 Gender: Male Note Status: Finalized Procedure:             Colonoscopy Indications:           Hematochezia, Unexplained iron deficiency anemia Providers:             Toney Reilohini Reddy Kaiser Belluomini MD, MD Referring MD:          No Local Md, MD (Referring MD) Medicines:             General Anesthesia Complications:         No immediate complications. Estimated blood loss:                         Minimal. Procedure:             Pre-Anesthesia Assessment:                        - Prior to the procedure, a History and Physical was                         performed, and patient medications and allergies were                         reviewed. The patient is competent. The risks and                         benefits of the procedure and the sedation options and                         risks were discussed with the patient. All questions                         were answered and informed consent was obtained.                         Patient identification and proposed procedure were                         verified by the physician, the nurse, the                         anesthesiologist, the anesthetist and the technician                         in the pre-procedure area in the procedure room in the                         endoscopy suite. Mental Status Examination: alert and                         oriented. Airway Examination: normal oropharyngeal                         airway and neck mobility. Respiratory Examination:  clear to auscultation. CV Examination: normal.                         Prophylactic Antibiotics: The patient does not require                         prophylactic antibiotics. Prior Anticoagulants: The                         patient  has taken no previous anticoagulant or                         antiplatelet agents. ASA Grade Assessment: II - A                         patient with mild systemic disease. After reviewing                         the risks and benefits, the patient was deemed in                         satisfactory condition to undergo the procedure. The                         anesthesia plan was to use general anesthesia.                         Immediately prior to administration of medications,                         the patient was re-assessed for adequacy to receive                         sedatives. The heart rate, respiratory rate, oxygen                         saturations, blood pressure, adequacy of pulmonary                         ventilation, and response to care were monitored                         throughout the procedure. The physical status of the                         patient was re-assessed after the procedure.                        After obtaining informed consent, the colonoscope was                         passed under direct vision. Throughout the procedure,                         the patient's blood pressure, pulse, and oxygen                         saturations were monitored continuously. The  Colonoscope was introduced through the anus and                         advanced to the the terminal ileum, with                         identification of the appendiceal orifice and IC                         valve. The colonoscopy was performed without                         difficulty. The patient tolerated the procedure well.                         The quality of the bowel preparation was evaluated                         using the BBPS [facility] Bowel Preparation Scale) with                         scores of: Right Colon = 3, Transverse Colon = 3 and                         Left Colon = 3 (entire mucosa seen well with no                         residual  staining, small fragments of stool or opaque                         liquid). The total BBPS score equals 9. Findings:      The perianal and digital rectal examinations were normal. Pertinent       negatives include normal sphincter tone and no palpable rectal lesions.      The terminal ileum appeared normal. Biopsies were taken with a cold       forceps for histology.      Inflammation was found in a continuous and circumferential pattern from       the rectum to the hepatic flexure. This was graded as Mayo Score 2       (moderate, with marked erythema, absent vascular pattern, friability,       erosions), and when compared to the previous examination, the findings       are worsened. Biopsies were taken with a cold forceps for histology.       Estimated blood loss was minimal. Impression:            - The examined portion of the ileum was normal.                         Biopsied.                        - Moderately active (Mayo Score 2) pancolitis                         ulcerative colitis, worsened since the last  examination. Biopsied. Recommendation:        - Discharge patient to home (with escort).                        - Resume previous diet today.                        - Continue present medications.                        - Await pathology results.                        - Return to my office as previously scheduled.                        - Use prednisone 40 mg PO once a day today. Procedure Code(s):     --- Professional ---                        [phone], Colonoscopy, flexible; with biopsy, single or                         multiple Diagnosis Code(s):     --- Professional ---                        K51.00, Ulcerative (chronic) pancolitis without                         complications                        K92.1, Melena (includes Hematochezia)                        D50.9, Iron deficiency anemia, unspecified CPT copyright 2019 American Medical  Association. All rights reserved. The codes documented in this report are preliminary and upon coder review may  be revised to meet current compliance requirements. Dr. Libby Maw Toney Reil MD, MD 03/18/2021 9:33:32 AM This report has been signed electronically. Number of Addenda: 0 Note Initiated On: 03/18/2021 8:46 AM Scope Withdrawal Time: 0 hours 13 minutes 31 seconds  Total Procedure Duration: 0 hours 18 minutes 4 seconds  Estimated Blood Loss:  Estimated blood loss was minimal.      [facility]

## 2021-03-18 NOTE — Telephone Encounter (Signed)
Patient wife called on the Prednisone because there is not enough pills for the instructions. These are the instructions for the prednisone that was given.   Take 4 tablets (40 mg total) by mouth daily for 14 days, THEN 3 tablets (30 mg total) daily for 7 days, THEN 2 tablets (20 mg total) daily for 7 days, THEN 1 tablet (10 mg total) daily for 7 days

## 2021-03-18 NOTE — Op Note (Signed)
[facility] Gastroenterology Patient Name: Ian Washington Procedure Date: 03/18/2021 8:46 AM MRN: [id number] Account #: [id number] Date of Birth: [birth year] Admit Type: Outpatient Age: 45 Room: [facility] ENDO ROOM 4 Gender: Male Note Status: Finalized Procedure:             Upper GI endoscopy Indications:           Unexplained iron deficiency anemia Providers:             Toney Reil MD, MD Referring MD:          No Local Md, MD (Referring MD) Medicines:             General Anesthesia Complications:         No immediate complications. Estimated blood loss: None. Procedure:             Pre-Anesthesia Assessment:                        - Prior to the procedure, a History and Physical was                         performed, and patient medications and allergies were                         reviewed. The patient is competent. The risks and                         benefits of the procedure and the sedation options and                         risks were discussed with the patient. All questions                         were answered and informed consent was obtained.                         Patient identification and proposed procedure were                         verified by the physician, the nurse, the                         anesthesiologist, the anesthetist and the technician                         in the pre-procedure area in the procedure room in the                         endoscopy suite. Mental Status Examination: alert and                         oriented. Airway Examination: normal oropharyngeal                         airway and neck mobility. Respiratory Examination:                         clear to auscultation. CV Examination: normal.  Prophylactic Antibiotics: The patient does not require                         prophylactic antibiotics. Prior Anticoagulants: The                         patient has taken no previous anticoagulant  or                         antiplatelet agents. ASA Grade Assessment: II - A                         patient with mild systemic disease. After reviewing                         the risks and benefits, the patient was deemed in                         satisfactory condition to undergo the procedure. The                         anesthesia plan was to use general anesthesia.                         Immediately prior to administration of medications,                         the patient was re-assessed for adequacy to receive                         sedatives. The heart rate, respiratory rate, oxygen                         saturations, blood pressure, adequacy of pulmonary                         ventilation, and response to care were monitored                         throughout the procedure. The physical status of the                         patient was re-assessed after the procedure.                        After obtaining informed consent, the endoscope was                         passed under direct vision. Throughout the procedure,                         the patient's blood pressure, pulse, and oxygen                         saturations were monitored continuously. The Endoscope                         was introduced through the mouth, and advanced to the  second part of duodenum. The upper GI endoscopy was                         accomplished without difficulty. The patient tolerated                         the procedure well. Findings:      The duodenal bulb and second portion of the duodenum were normal.      Patchy mildly erythematous mucosa was found in the gastric body and in       the gastric antrum. Biopsies were taken with a cold forceps for       Helicobacter pylori testing.      The cardia and gastric fundus were normal on retroflexion.      Esophagogastric landmarks were identified: the gastroesophageal junction       was found at 40 cm from the  incisors.      The gastroesophageal junction and examined esophagus were normal. Impression:            - Normal duodenal bulb and second portion of the                         duodenum.                        - Erythematous mucosa in the gastric body and antrum.                         Biopsied.                        - Esophagogastric landmarks identified.                        - Normal gastroesophageal junction and esophagus. Recommendation:        - Await pathology results.                        - Proceed with colonoscopy as scheduled                        See colonoscopy report Procedure Code(s):     --- Professional ---                        [phone], Esophagogastroduodenoscopy, flexible,                         transoral; with biopsy, single or multiple Diagnosis Code(s):     --- Professional ---                        K31.89, Other diseases of stomach and duodenum                        D50.9, Iron deficiency anemia, unspecified CPT copyright 2019 American Medical Association. All rights reserved. The codes documented in this report are preliminary and upon coder review may  be revised to meet current compliance requirements. Dr. Libby Maw Toney Reil MD, MD 03/18/2021 9:09:04 AM This report has been signed electronically. Number of Addenda: 0 Note Initiated On: 03/18/2021 8:46 AM Estimated Blood Loss:  Estimated  blood loss: none.      [facility]

## 2021-03-18 NOTE — Anesthesia Postprocedure Evaluation (Signed)
Anesthesia Post Note  Patient: Ian Washington  Procedure(s) Performed: COLONOSCOPY WITH PROPOFOL ESOPHAGOGASTRODUODENOSCOPY (EGD) WITH PROPOFOL  Patient location during evaluation: Phase II Anesthesia Type: General Level of consciousness: awake and alert, awake and oriented Pain management: pain level controlled Vital Signs Assessment: post-procedure vital signs reviewed and stable Respiratory status: spontaneous breathing, nonlabored ventilation and respiratory function stable Cardiovascular status: blood pressure returned to baseline and stable Postop Assessment: no apparent nausea or vomiting Anesthetic complications: no   No notable events documented.   Last Vitals:  Vitals:   03/18/21 0831 03/18/21 0933  BP: 138/78 (!) 105/59  Pulse: 100   Resp: 18   Temp: (!) 36 C (!) 36.1 C  SpO2: 98%     Last Pain:  Vitals:   03/18/21 0933  TempSrc:   PainSc: Asleep                 Manfred Arch

## 2021-03-18 NOTE — H&P (Signed)
  Arlyss Repress, MD [address]  [address]  [place], [place] [address]  Main: [phone]  Fax: [phone] Pager: [phone]  Primary Care Physician:  System, Provider Not In Primary Gastroenterologist:  Dr. Arlyss Repress  Pre-Procedure History & Physical: HPI:  Ian Washington is a 45 y.o. male is here for an endoscopy and colonoscopy.   Past Medical History:  Diagnosis Date   Asthma    UC (ulcerative colitis) [facility])     Past Surgical History:  Procedure Laterality Date   CHOLECYSTECTOMY  2011    Prior to Admission medications   Medication Sig Start Date End Date Taking? Authorizing Provider  Iron-FA-B Cmp-C-Biot-Probiotic (FUSION PLUS) CAPS Take 1 capsule by mouth daily. 02/24/21 03/26/21 Yes Eufelia Veno, Loel Dubonnet, MD  sildenafil (VIAGRA) 50 MG tablet Take 50 mg by mouth daily as needed. 01/13/21   [provider]    Allergies as of 02/17/2021   (No Known Allergies)    Family History  Problem Relation Age of Onset   Pancreatic cancer Mother    Diabetes Mother    Diabetes Maternal Grandmother    Breast cancer Maternal Aunt     Social History   Socioeconomic History   Marital status: Married    Spouse name: Not on file   Number of children: Not on file   Years of education: Not on file   Highest education level: Not on file  Occupational History   Not on file  Tobacco Use   Smoking status: Never   Smokeless tobacco: Never  Vaping Use   Vaping Use: Never used  Substance and Sexual Activity   Alcohol use: No   Drug use: No   Sexual activity: Not on file  Other Topics Concern   Not on file  Social History Narrative   Works as Education officer, environmental    Married    One child 3 y./o    Social Determinants of Corporate investment banker Strain: Not on file  Food Insecurity: Not on file  Transportation Needs: Not on file  Physical Activity: Not on file  Stress: Not on file  Social Connections: Not on file  Intimate Partner Violence: Not on file     Review of Systems: See HPI, otherwise negative ROS  Physical Exam: BP 138/78   Pulse 100   Temp (!) 96.8 F (36 C) (Temporal)   Resp 18   Ht 5' 9.5" (1.765 m)   Wt 88.5 kg   SpO2 98%   BMI 28.38 kg/m  General:   Alert,  pleasant and cooperative in NAD Head:  Normocephalic and atraumatic. Neck:  Supple; no masses or thyromegaly. Lungs:  Clear throughout to auscultation.    Heart:  Regular rate and rhythm. Abdomen:  Soft, nontender and nondistended. Normal bowel sounds, without guarding, and without rebound.   Neurologic:  Alert and  oriented x4;  grossly normal neurologically.  Impression/Plan: Ian Washington is here for an endoscopy and colonoscopy to be performed for IDA  Risks, benefits, limitations, and alternatives regarding  endoscopy and colonoscopy have been reviewed with the patient.  Questions have been answered.  All parties agreeable.   Lannette Donath, MD  03/18/2021, 8:44 AM

## 2021-03-18 NOTE — Addendum Note (Signed)
Addended by: Radene Knee L on: 03/18/2021 02:10 PM   Modules accepted: Orders

## 2021-03-18 NOTE — Transfer of Care (Signed)
Immediate Anesthesia Transfer of Care Note  Patient: Ian Washington  Procedure(s) Performed: COLONOSCOPY WITH PROPOFOL ESOPHAGOGASTRODUODENOSCOPY (EGD) WITH PROPOFOL  Patient Location: PACU  Anesthesia Type:General  Level of Consciousness: sedated and drowsy  Airway & Oxygen Therapy: Patient Spontanous Breathing  Post-op Assessment: Report given to RN, Post -op Vital signs reviewed and stable and Patient moving all extremities  Post vital signs: Reviewed and stable  Last Vitals:  Vitals Value Taken Time  BP 105/59 03/18/21 0935  Temp 36.1 C 03/18/21 0933  Pulse 105 03/18/21 0935  Resp 14 03/18/21 0935  SpO2 96 % 03/18/21 0935  Vitals shown include unvalidated device data.  Last Pain:  Vitals:   03/18/21 0933  TempSrc:   PainSc: Asleep         Complications: No notable events documented.

## 2021-03-18 NOTE — Telephone Encounter (Signed)
I have sent medication with the right number of pills to the pharmacy

## 2021-03-19 ENCOUNTER — Encounter: Payer: Self-pay | Admitting: Gastroenterology

## 2021-03-19 LAB — SURGICAL PATHOLOGY

## 2021-03-22 ENCOUNTER — Telehealth: Payer: Self-pay

## 2021-03-22 NOTE — Telephone Encounter (Signed)
Asked patient if they could come to the office or do a mychart visit for Wednesday 07/20/222 at 1:00pm. Patient states no he will be on a plain on Wednesday

## 2021-03-22 NOTE — Telephone Encounter (Signed)
Made patient  a appointment for 03/30/2021

## 2021-03-22 NOTE — Telephone Encounter (Signed)
-----   Message from Toney Reil, MD sent at 03/22/2021  9:41 AM EDT ----- Biopsies from colonoscopy do confirm ulcerative colitis. Please make a follow up appt to see me next week to discuss treatment options  RV

## 2021-03-24 ENCOUNTER — Encounter: Payer: Self-pay | Admitting: Gastroenterology

## 2021-03-24 LAB — QUANTIFERON-TB GOLD PLUS
QuantiFERON Mitogen Value: >10 IU/mL
QuantiFERON Nil Value: 0 IU/mL
QuantiFERON TB1 Ag Value: 0 IU/mL
QuantiFERON TB2 Ag Value: 0 IU/mL
QuantiFERON-TB Gold Plus: NEGATIVE

## 2021-03-24 LAB — HEPATITIS B SURFACE ANTIBODY,QUALITATIVE: Hep B Surface Ab, Qual: NONREACTIVE

## 2021-03-24 LAB — HEPATITIS A ANTIBODY, TOTAL: hep A Total Ab: NEGATIVE

## 2021-03-24 LAB — HEPATITIS B CORE ANTIBODY, TOTAL: Hep B Core Total Ab: NEGATIVE

## 2021-03-24 LAB — HEPATITIS C ANTIBODY: Hep C Virus Ab: 0.2 s/co ratio (ref 0.0–0.9)

## 2021-03-24 LAB — HEPATITIS B SURFACE ANTIGEN: Hepatitis B Surface Ag: NEGATIVE

## 2021-03-24 LAB — THIOPURINE METHYLTRANSFERASE (TPMT), RBC: TPMT Activity:: 17.7 Units/mL RBC

## 2021-03-26 LAB — CALPROTECTIN, FECAL: Calprotectin, Fecal: 920 ug/g — ABNORMAL HIGH (ref 0–120)

## 2021-03-30 ENCOUNTER — Encounter: Payer: Self-pay | Admitting: Gastroenterology

## 2021-03-30 ENCOUNTER — Telehealth (INDEPENDENT_AMBULATORY_CARE_PROVIDER_SITE_OTHER): Payer: 59 | Admitting: Gastroenterology

## 2021-03-30 DIAGNOSIS — K51011 Ulcerative (chronic) pancolitis with rectal bleeding: Secondary | ICD-10-CM

## 2021-03-30 DIAGNOSIS — D500 Iron deficiency anemia secondary to blood loss (chronic): Secondary | ICD-10-CM

## 2021-03-30 NOTE — Progress Notes (Signed)
Lannette Donath, MD [address]  [address]  [place], [place] [address]  Main: [phone]  Fax: [phone]    Gastroenterology Consultation Video Visit  Referring Provider:     No ref. provider found Primary Care Physician:  System, Provider Not In Primary Gastroenterologist:  Dr. Arlyss Repress Reason for Consultation: Ulcerative colitis        HPI:   Ian Washington is a 45 y.o. male referred by Dr. Cynda Familia, Provider Not In  for consultation & management of new diagnosis of ulcerative pancolitis  Virtual Visit Video Note  I connected with Margit Banda on 03/30/21 at 10:00 AM EDT by video and verified that I am speaking with the correct person using two identifiers.   I discussed the limitations, risks, security and privacy concerns of performing an evaluation and management service by video and the availability of in person appointments. I also discussed with the patient that there may be a patient responsible charge related to this service. The patient expressed understanding and agreed to proceed.  Location of the Patient: Home  Location of the provider: Home office  Persons participating in the visit: Patient and provider only   History of Present Illness: Ian Washington is a 45 year old pleasant African-American male who lives in [place], was originally seen by me on 02/17/21 with 3 months history of rectal bleeding associated with constipation and diarrhea, rectal urgency and abdominal cramps.  He carries a previous diagnosis of ulcerative colitis more than 20 years ago and was not on any treatment.  I repeated colonoscopy which revealed mild to moderate inflammation extending from hepatic flexure to rectum, and biopsies confirmed chronic active colitis.  His calprotectin levels were also elevated to 920.  He did have iron deficiency anemia.  I started him on short course of prednisone 40 mg daily.  He reports that his rectal bleeding has subsided.  He tends  to have more constipation for which he is taking Metamucil daily. This is a follow-up visit to discuss about pathology results and long-term treatment options  NSAIDs: None  Antiplts/Anticoagulants/Anti thrombotics: None  Patient denies family history of IBD, GI malignancy he does not smoke or drink alcohol  Patient denies any extraintestinal manifestations  GI Procedures:  EGD and colonoscopy 03/18/2021 - Normal duodenal bulb and second portion of the duodenum. - Erythematous mucosa in the gastric body and antrum. Biopsied. - Esophagogastric landmarks identified. - Normal gastroesophageal junction and esophagus.  - The examined portion of the ileum was normal. Biopsied. - Moderately active (Mayo Score 2) pancolitis ulcerative colitis, worsened since the last examination. Biopsied. DIAGNOSIS:  A. STOMACH; BIOPSY:  - MILD CHRONIC GASTRITIS.  - NEGATIVE FOR H. PYLORI, INTESTINAL METAPLASIA, DYSPLASIA AND  MALIGNANCY.   B. TERMINAL ILEUM, BIOPSY:  - NEGATIVE FOR ACTIVE ILEITIS, GRANULOMATA AND DYSPLASIA.   C. COLON, CECUM AND ASCENDING COLON; BIOPSIES:  - NEGATIVE FOR ACTIVE MUCOSAL INFLAMMATION, GRANULOMATA AND DYSPLASIA.   D. COLON, TRANSVERSE; BIOPSIES:  - ACTIVE MUCOSAL COLITIS.  - NEGATIVE FOR DYSPLASIA.   E. COLON, DESCENDING; BIOPSIES:  - CHRONIC ACTIVE MUCOSAL COLITIS CONSISTENT WITH IDIOPATHIC CHRONIC  INFLAMMATORY BOWEL DISEASE (FAVOR ULCERATIVE COLITIS).  - NEGATIVE FOR DYSPLASIA.   F. COLON, SIGMOID; BIOPSIES:  - CHRONIC ACTIVE MUCOSAL COLITIS CONSISTENT WITH IDIOPATHIC CHRONIC  INFLAMMATORY BOWEL DISEASE (FAVOR OF ULCERATIVE COLITIS).  - NEGATIVE FOR DYSPLASIA.   G.  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  - NEGATIVE FOR DYSPLASIA.   Past Medical History:  Diagnosis Date   Asthma    UC (ulcerative colitis) [facility])     Past Surgical History:   Procedure Laterality Date   CHOLECYSTECTOMY  2011   COLONOSCOPY WITH PROPOFOL N/A 03/18/2021   Procedure: COLONOSCOPY WITH PROPOFOL;  Surgeon: Toney Reil, MD;  [facility] ENDOSCOPY;  Service: Gastroenterology;  Laterality: N/A;   ESOPHAGOGASTRODUODENOSCOPY (EGD) WITH PROPOFOL N/A 03/18/2021   Procedure: ESOPHAGOGASTRODUODENOSCOPY (EGD) WITH PROPOFOL;  Surgeon: Toney Reil, MD;  Location[facility] ENDOSCOPY;  Service: Gastroenterology;  Laterality: N/A;    Current Outpatient Medications:    clobetasol ointment (TEMOVATE) 0.05 %, Apply topically., Disp: , Rfl:    ketoconazole (NIZORAL) 2 % shampoo, SMARTSIG:Topical 2-3 Times Weekly, Disp: , Rfl:    predniSONE (DELTASONE) 10 MG tablet, Take 4 tablets (40 mg total) by mouth daily with breakfast for 14 days, THEN 3 tablets (30 mg total) daily with breakfast for 7 days, THEN 2 tablets (20 mg total) daily with breakfast for 7 days, THEN 1 tablet (10 mg total) daily with breakfast for 7 days., Disp: 98 tablet, Rfl: 0   sildenafil (VIAGRA) 50 MG tablet, Take 50 mg by mouth daily as needed., Disp: , Rfl:   Family History  Problem Relation Age of Onset   Pancreatic cancer Mother    Diabetes Mother    Diabetes Maternal Grandmother    Breast cancer Maternal Aunt      Social History   Tobacco Use   Smoking status: Never   Smokeless tobacco: Never  Vaping Use   Vaping Use: Never used  Substance Use Topics   Alcohol use: No   Drug use: No    Allergies as of 03/30/2021   (No Known Allergies)     Imaging Studies: No abdominal imaging  Assessment and Plan:   Ian Washington is a 45 y.o. pleasant African-American male with new diagnosis of ulcerative pancolitis in 7/22, biopsy-proven  Ulcerative pancolitis Continue short course of prednisone, patient is currently finishing 2 weeks of 40 mg daily, will taper down by 10 mg weekly Given the extent and severity of disease, I recommend the patient to be started on biologic.   I have discussed with him regarding various classes of Biologics including anti-TNF, vedolizumab, Stelara and oral agents.  Patient preferred injectable medication Humira.  I have discussed the risks and benefits of anti-TNF therapy including but not limited to the risks of infection, injection site reaction, anaphylaxis, shingles, small risk of lymphoma, small risk of skin cancer.  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 Anemia: Iron deficiency anemia, continue fusion plus, recheck levels during next visit 3.Immunizations: Hep A and B serologies are negative, recommend Twinrix vaccine, Influenza recommend annually, prevnar recommend, pneumovax recommend, Varicella unknown, zoster recommend Shingrix vaccine 4.Cancer screening I) Colon cancer/dysplasia surveillance: No evidence of dysplasia, up-to-date II) Skin cancer - counseled about annual skin exam by dermatology and skin protection in summer using sun screen SPF > 50, clothing 5.Bone health Vitamin D status: Check during next visit Bone density testing: Not done, not needed at this time 5. Labs: Every 3 months 6. Smoking: Non-smoker 7. NSAIDs and Antibiotics use: n/a  Follow Up Instructions:   I discussed the assessment and treatment plan with the patient. The patient was provided an opportunity to ask questions and all were answered. The patient agreed with the plan and demonstrated an understanding of the instructions.   The patient was advised to call back or seek an in-person evaluation if the symptoms worsen or if the condition fails to improve as anticipated.  I provided 25 minutes of face-to-face time during this  encounter.   Follow up in 2 months   Arlyss Repress, MD

## 2021-04-01 ENCOUNTER — Encounter: Payer: Self-pay | Admitting: Gastroenterology

## 2021-04-06 ENCOUNTER — Telehealth: Payer: Self-pay

## 2021-04-06 NOTE — Telephone Encounter (Signed)
Emailed Armed forces training and education officer with [facility] to find out if the had the appeal letter for this patient so I can find out where to fax the appeal letter to. He email me back the fax number for the appeal letter which is Fax: [phone] Expedited / Urgent Fax: [phone]. Faxed letter to the urgent fax number and waiting on response from insurance company

## 2021-05-24 ENCOUNTER — Ambulatory Visit: Payer: BC Managed Care – PPO | Admitting: Gastroenterology

## 2021-05-25 ENCOUNTER — Ambulatory Visit: Payer: BC Managed Care – PPO | Admitting: Gastroenterology

## 2021-05-25 ENCOUNTER — Ambulatory Visit: Payer: 59 | Admitting: Gastroenterology

## 2021-07-06 ENCOUNTER — Encounter: Payer: Self-pay | Admitting: Gastroenterology

## 2021-07-07 ENCOUNTER — Encounter: Payer: Self-pay | Admitting: Gastroenterology

## 2021-07-07 ENCOUNTER — Telehealth (INDEPENDENT_AMBULATORY_CARE_PROVIDER_SITE_OTHER): Payer: 59 | Admitting: Gastroenterology

## 2021-07-07 ENCOUNTER — Ambulatory Visit: Payer: 59 | Admitting: Gastroenterology

## 2021-07-07 ENCOUNTER — Telehealth: Payer: Self-pay

## 2021-07-07 DIAGNOSIS — K51011 Ulcerative (chronic) pancolitis with rectal bleeding: Secondary | ICD-10-CM

## 2021-07-07 DIAGNOSIS — D500 Iron deficiency anemia secondary to blood loss (chronic): Secondary | ICD-10-CM | POA: Diagnosis not present

## 2021-07-07 NOTE — Progress Notes (Signed)
Sherri Sear, MD [address]  [place]  [place], [place] [address]  Main: [phone]  Fax: [phone]    Gastroenterology Consultation Video Visit  Referring Provider:     No ref. provider found Primary Care Physician:  System, Provider Not In Primary Gastroenterologist:  Dr. Cephas Darby Reason for Consultation: Ulcerative colitis        HPI:   Ian Washington is a 45 y.o. male referred by Dr. Estanislado Spire, Provider Not In  for consultation & management of new diagnosis of ulcerative pancolitis  Virtual Visit Video Note  I connected with Ian Washington on 07/07/21 at  1:45 PM EDT by video and verified that I am speaking with the correct person using two identifiers.   I discussed the limitations, risks, security and privacy concerns of performing an evaluation and management service by video and the availability of in person appointments. I also discussed with the patient that there may be a patient responsible charge related to this service. The patient expressed understanding and agreed to proceed.  Location of the Patient: Home  Location of the provider: Home office  Persons participating in the visit: Patient and provider only   History of Present Illness: Ian Washington is a 45 year old pleasant African-American male who lives in [place], was originally seen by me on 02/17/21 with 3 months history of rectal bleeding associated with constipation and diarrhea, rectal urgency and abdominal cramps.  He carries a previous diagnosis of ulcerative colitis more than 20 years ago and was not on any treatment.  I repeated colonoscopy which revealed mild to moderate inflammation extending from hepatic flexure to rectum, and biopsies confirmed chronic active colitis.  His calprotectin levels were also elevated to 920.  He did have iron deficiency anemia.  I started him on short course of prednisone 40 mg daily.  He reports that his rectal bleeding has subsided.  He tends  to have more constipation for which he is taking Metamucil daily. This is a follow-up visit to discuss about pathology results and long-term treatment options  Follow-up visit 11-22 Patient is doing well from colitis standpoint.  He finished prednisone course.  Currently in clinical remission.  He continues to take iron supplements daily.  He does not have any concerns today.  He is not aware if Humira is approved.  NSAIDs: None  Antiplts/Anticoagulants/Anti thrombotics: None  Patient denies family history of IBD, GI malignancy he does not smoke or drink alcohol  Patient denies any extraintestinal manifestations  GI Procedures:  EGD and colonoscopy 03/18/2021 - Normal duodenal bulb and second portion of the duodenum. - Erythematous mucosa in the gastric body and antrum. Biopsied. - Esophagogastric landmarks identified. - Normal gastroesophageal junction and esophagus.  - The examined portion of the ileum was normal. Biopsied. - Moderately active (Mayo Score 2) pancolitis ulcerative colitis, worsened since the last examination. Biopsied. DIAGNOSIS:  A. STOMACH; BIOPSY:  - MILD CHRONIC GASTRITIS.  - NEGATIVE FOR H. PYLORI, INTESTINAL METAPLASIA, DYSPLASIA AND  MALIGNANCY.   B. TERMINAL ILEUM, BIOPSY:  - NEGATIVE FOR ACTIVE ILEITIS, GRANULOMATA AND DYSPLASIA.   C. COLON, CECUM AND ASCENDING COLON; BIOPSIES:  - NEGATIVE FOR ACTIVE MUCOSAL INFLAMMATION, GRANULOMATA AND DYSPLASIA.   D. COLON, TRANSVERSE; BIOPSIES:  - ACTIVE MUCOSAL COLITIS.  - NEGATIVE FOR DYSPLASIA.   E. COLON, DESCENDING; BIOPSIES:  - CHRONIC ACTIVE MUCOSAL COLITIS CONSISTENT WITH IDIOPATHIC CHRONIC  INFLAMMATORY BOWEL DISEASE (FAVOR ULCERATIVE COLITIS).  - NEGATIVE FOR DYSPLASIA.   F. COLON,  SIGMOID; BIOPSIES:  - CHRONIC ACTIVE MUCOSAL COLITIS CONSISTENT WITH IDIOPATHIC CHRONIC  INFLAMMATORY BOWEL DISEASE (FAVOR OF ULCERATIVE COLITIS).  - NEGATIVE FOR DYSPLASIA.   G.  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  - NEGATIVE FOR DYSPLASIA.   Past Medical History:  Diagnosis Date   Asthma    UC (ulcerative colitis) [facility])     Past Surgical History:  Procedure Laterality Date   CHOLECYSTECTOMY  2011   COLONOSCOPY WITH PROPOFOL N/A 03/18/2021   Procedure: COLONOSCOPY WITH PROPOFOL;  Surgeon: Lin Landsman, MD;  Location[facility] ENDOSCOPY;  Service: Gastroenterology;  Laterality: N/A;   ESOPHAGOGASTRODUODENOSCOPY (EGD) WITH PROPOFOL N/A 03/18/2021   Procedure: ESOPHAGOGASTRODUODENOSCOPY (EGD) WITH PROPOFOL;  Surgeon: Lin Landsman, MD;  Location: [facility] ENDOSCOPY;  Service: Gastroenterology;  Laterality: N/A;    Current Outpatient Medications:    clobetasol ointment (TEMOVATE) 0.05 %, Apply topically., Disp: , Rfl:    ketoconazole (NIZORAL) 2 % shampoo, , Disp: , Rfl:    sildenafil (VIAGRA) 50 MG tablet, Take 50 mg by mouth daily as needed., Disp: , Rfl:    HUMIRA PEN 40 MG/0.4ML PNKT, SMARTSIG:40 Milligram(s) SUB-Q Every 2 Weeks (Patient not taking: Reported on 07/07/2021), Disp: , Rfl:   Family History  Problem Relation Age of Onset   Pancreatic cancer Mother    Diabetes Mother    Diabetes Maternal Grandmother    Breast cancer Maternal Aunt      Social History   Tobacco Use   Smoking status: Never   Smokeless tobacco: Never  Vaping Use   Vaping Use: Never used  Substance Use Topics   Alcohol use: No   Drug use: No    Allergies as of 07/07/2021   (No Known Allergies)     Imaging Studies: No abdominal imaging  Assessment and Plan:   Ian Washington is a 45 y.o. pleasant African-American male with new diagnosis of ulcerative pancolitis in 7/22, biopsy-proven  Ulcerative pancolitis S/p short course of prednisone 2 weeks of 40 mg daily, followed by quick taper. 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 Anemia: Iron deficiency anemia, continue fusion plus, recheck levels today 3.Immunizations: Hep A and B serologies are negative, received first dose of Twinrix vaccine, Influenza recommend annually, prevnar received, pneumovax recommend, Varicella unknown, received Shingrix vaccine first dose 4.Cancer screening I) Colon cancer/dysplasia surveillance: No evidence of dysplasia, up-to-date II) Skin cancer - counseled about annual skin exam by dermatology and skin protection in summer using sun screen SPF > 50, clothing 5.Bone health Vitamin D status: Check during next visit Bone density testing: Not done, not needed at this time 5. Labs: Every 3 months 6. Smoking: Non-smoker 7. NSAIDs and Antibiotics use: n/a    Follow Up Instructions:   I discussed the assessment and treatment plan with the patient. The patient was provided an opportunity to ask questions and all were answered. The patient agreed with the plan and demonstrated an understanding of the instructions.   The patient was advised to call back or seek an in-person evaluation if the symptoms worsen or if the condition fails to improve as anticipated.  I provided 25 minutes of face-to-face time during this encounter.   Follow up in 3-4 months   Arlyss Repress, MD

## 2021-07-07 NOTE — Telephone Encounter (Signed)
Called patient to reschedule appointment no answer

## 2021-07-07 NOTE — Addendum Note (Signed)
Addended by: Linward Foster on: 07/07/2021 03:24 PM   Modules accepted: Orders

## 2021-07-22 ENCOUNTER — Telehealth: Payer: Self-pay

## 2021-07-22 ENCOUNTER — Other Ambulatory Visit: Payer: Self-pay

## 2021-07-22 NOTE — Telephone Encounter (Signed)
Pt called returning your phone call  

## 2021-07-22 NOTE — Telephone Encounter (Signed)
CALLED PATIENT NO ANSWER LEFT VOICEMAIL FOR A CALL BACK ? ?

## 2021-07-22 NOTE — Progress Notes (Signed)
Made sure labs are released

## 2021-07-22 NOTE — Telephone Encounter (Signed)
Called patient gave him message from dr Allegra Lai he is going to get his medicine now and try to administer his self if he cant he will call us back to sign him up for the nurse program he is also going to get the labs done that were out in for him and will follow up in a month to see how things are doing and he will come in office in 4 months

## 2021-08-20 ENCOUNTER — Encounter: Payer: Self-pay | Admitting: Gastroenterology

## 2022-02-25 ENCOUNTER — Telehealth: Payer: Self-pay | Admitting: Gastroenterology

## 2022-03-01 ENCOUNTER — Telehealth: Payer: Self-pay | Admitting: Gastroenterology

## 2022-03-01 NOTE — Telephone Encounter (Signed)
Medical records were sent out on 06/26 to medical release

## 2022-03-05 ENCOUNTER — Encounter: Payer: Self-pay | Admitting: Gastroenterology

## 2022-05-07 ENCOUNTER — Other Ambulatory Visit: Payer: Self-pay | Admitting: Gastroenterology

## 2022-07-05 ENCOUNTER — Telehealth: Payer: Self-pay

## 2022-07-05 MED ORDER — HUMIRA (2 PEN) 40 MG/0.4ML ~~LOC~~ AJKT: AUTO-INJECTOR | SUBCUTANEOUS | 0 refills | Status: DC

## 2022-07-05 NOTE — Telephone Encounter (Signed)
Patient is calling because he needs a follow up appointment with Dr. Marius Ditch so he can get the Humira refilled to [facility]. Made appointment for 08/03/22 and refilled Humira till appointment

## 2022-07-11 ENCOUNTER — Telehealth: Payer: Self-pay

## 2022-07-11 NOTE — Telephone Encounter (Signed)
Insurance approved PA through 07/12/2023

## 2022-07-11 NOTE — Telephone Encounter (Signed)
[facility] is calling to see if we could do the PA over the phone for the [facility] on the phone. Informed represented that patient has not been seen in over a year. They took the information and fax over the last office visit note   Case Reference number [id number] reviewed in the next 4 days. Fax number is [phone]

## 2022-08-01 ENCOUNTER — Other Ambulatory Visit: Payer: Self-pay | Admitting: Gastroenterology

## 2022-08-03 ENCOUNTER — Ambulatory Visit: Payer: BC Managed Care – PPO | Admitting: Gastroenterology

## 2022-08-03 ENCOUNTER — Encounter: Payer: Self-pay | Admitting: Gastroenterology

## 2022-08-03 ENCOUNTER — Other Ambulatory Visit: Payer: Self-pay

## 2022-08-03 VITALS — BP 132/88 | HR 81 | Temp 98.2°F | Ht 69.5 in | Wt 200.4 lb

## 2022-08-03 DIAGNOSIS — K51011 Ulcerative (chronic) pancolitis with rectal bleeding: Secondary | ICD-10-CM | POA: Insufficient documentation

## 2022-08-03 DIAGNOSIS — D508 Other iron deficiency anemias: Secondary | ICD-10-CM | POA: Diagnosis not present

## 2022-08-03 DIAGNOSIS — K5100 Ulcerative (chronic) pancolitis without complications: Secondary | ICD-10-CM

## 2022-08-03 MED ORDER — SHINGRIX 50 MCG/0.5ML IM SUSR: 0.5000 mL | Freq: Once | INTRAMUSCULAR | 0 refills | Status: AC

## 2022-08-03 NOTE — Progress Notes (Signed)
Cephas Darby, MD [address]  [place]  [place], [place] [address]  Main: [phone]  Fax: [phone]    Gastroenterology Consultation  Referring Provider:     No ref. provider found Primary Care Physician:  System, Provider Not In Primary Gastroenterologist:  Dr. Cephas Darby Reason for Consultation: Ulcerative pancolitis        HPI:   Ian Washington is a 46 y.o. male is seen in consultation for 3 management of  Ulcerative pancolitis. Ian Washington is a 46 year old pleasant African-American male who lives in [place], was originally seen by me on 02/17/21 with 3 months history of rectal bleeding associated with constipation and diarrhea, rectal urgency and abdominal cramps. He carries a previous diagnosis of ulcerative colitis more than 20 years ago and was not on any treatment. I repeated colonoscopy which revealed mild to moderate inflammation extending from hepatic flexure to rectum, and biopsies confirmed chronic active colitis. His calprotectin levels were also elevated to 920. He did have iron deficiency anemia.  He received short course of prednisone 40 mg daily and responded well.  Subsequently, started on biweekly Humira monotherapy.  He has been doing very well from ulcerative colitis standpoint.  He reports having 1 formed bowel movement daily.  He denies any GI symptoms today.  Patient tells me that he received yellow fever vaccine before he traveled to [place] while he was on Humira.  He did not inform me about receiving yellow fever vaccine.  He does not smoke or drink alcohol  NSAIDs: None  Antiplts/Anticoagulants/Anti thrombotics: None  GI Procedures: Colonoscopy over 10 years ago, reportedly normal, report not available  Upper endoscopy and colonoscopy 03/18/2021 - Normal duodenal bulb and second portion of the duodenum. - Erythematous mucosa in the gastric body and antrum. Biopsied. - Esophagogastric landmarks identified. - Normal  gastroesophageal junction and esophagus.  - The examined portion of the ileum was normal. Biopsied. - Moderately active (Mayo Score 2) pancolitis ulcerative colitis, worsened since the last examination. Biopsied.  DIAGNOSIS: A. STOMACH; BIOPSY: - MILD CHRONIC GASTRITIS. - NEGATIVE FOR H. PYLORI, INTESTINAL METAPLASIA, DYSPLASIA AND MALIGNANCY.  B. TERMINAL ILEUM, BIOPSY: - NEGATIVE FOR ACTIVE ILEITIS, GRANULOMATA AND DYSPLASIA.  C. COLON, CECUM AND ASCENDING COLON; BIOPSIES: - NEGATIVE FOR ACTIVE MUCOSAL INFLAMMATION, GRANULOMATA AND DYSPLASIA.  D. COLON, TRANSVERSE; BIOPSIES: - ACTIVE MUCOSAL COLITIS. - NEGATIVE FOR DYSPLASIA.  E. 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 - NEGATIVE FOR DYSPLASIA.  F. 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 - NEGATIVE FOR DYSPLASIA.  G.  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 - NEGATIVE FOR DYSPLASIA.   Patient denies family history of GI malignancy, IBD  Past Medical History:  Diagnosis Date   Asthma    Rectal bleeding    UC (ulcerative colitis) ([place])      Current Outpatient Medications:    albuterol (VENTOLIN HFA) 108 (90 Base) MCG/ACT inhaler, Inhale into the lungs., Disp: , Rfl:    atorvastatin (LIPITOR) 10 MG tablet, Take 10 mg by mouth daily., Disp: , Rfl:    HUMIRA PEN 40 MG/0.4ML PNKT, INJECT 40MG  SUBCUTANEOUSLY EVERY OTHER WEEK, Disp: 2 each, Rfl: 0   sildenafil (VIAGRA) 50 MG tablet, Take 50 mg by mouth daily as needed., Disp: , Rfl:     Family  History  Problem Relation Age of Onset   Pancreatic cancer Mother    Diabetes Mother    Diabetes Maternal Grandmother    Breast cancer Maternal Aunt       Social History   Tobacco Use   Smoking status: Never   Smokeless tobacco: Never  Vaping Use   Vaping Use: Never used  Substance Use Topics   Alcohol use: No   Drug use: No    Allergies as of 08/03/2022 - Review Complete 08/03/2022  Allergen Reaction Noted   Acai extract Hives 02/25/2022    Review of Systems:    All systems reviewed and negative except where noted in HPI.   Physical Exam:  BP (!) 150/82 (BP Location: Left Arm, Patient Position: Sitting, Cuff Size: Normal)   Pulse 78   Temp 98.2 F (36.8 C) (Oral)   Ht 5' 9.5" (1.765 m)   Wt 200 lb 6 oz (90.9 kg)   BMI 29.17 kg/m  No LMP for male patient.  General:   Alert,  Well-developed, well-nourished, pleasant and cooperative in NAD Head:  Normocephalic and atraumatic. Eyes:  Sclera clear, no icterus.   Conjunctiva pink. Ears:  Normal auditory acuity. Nose:  No deformity, discharge, or lesions. Mouth:  No deformity or lesions,oropharynx pink & moist. Neck:  Supple; no masses or thyromegaly. Lungs:  Respirations even and unlabored.  Clear throughout to auscultation.   No wheezes, crackles, or rhonchi. No acute distress. Heart:  Regular rate and rhythm; no murmurs, clicks, rubs, or gallops. Abdomen:  Normal bowel sounds. Soft, non-tender and non-distended without masses, hepatosplenomegaly or hernias noted.  No guarding or rebound tenderness.   Rectal: Not performed Msk:  Symmetrical without gross deformities. Good, equal movement & strength bilaterally. Pulses:  Normal pulses noted. Extremities:  No clubbing or edema.  No cyanosis. Neurologic:  Alert and oriented x3;  grossly normal neurologically. Skin:  Intact without significant lesions or rashes. No jaundice. Psych:  Alert and cooperative. Normal mood and affect.  Imaging Studies: No recent abdominal imaging  Assessment and Plan:   Ian Washington is a 46 y.o. pleasant African-American male with PanUlcerative colitis on Humira biweekly monotherapy  initiated in November 2022 currently in clinical remission  Continue Humira monotherapy biweekly Check CBC, CMP, fecal calprotectin levels, vitamin D, iron panel Recommend colonoscopy to assess response to therapy and endoscopy healing  IBD Health Maintenance  1.TB status: QuantiFERON gold negative on 03/18/2021 2. Anemia: Mild iron deficiency anemia, received iron supplements, recheck CBC and iron panel today 3.Immunizations: Hep A and B, serologies were negative.  Patient reports that he received hepatitis A and B vaccines, Influenza up-to-date with annual influenza vaccine, prevnar received, pneumovax received, Varicella unknown, Zoster has not received Shingrix vaccine, sent in prescription to his local pharmacy.  Today, I have discussed with patient against administering live viral vaccines while he is on ongoing immunosuppressive therapy which is Humira.  He is planning another trip to [place] in August 2024 4.Cancer screening I) Colon cancer/dysplasia surveillance: Recommend colonoscopy II) Skin cancer - counseled about annual skin exam by dermatology and skin protection in summer using sun screen SPF > 50, clothing 5.Bone health Vitamin D status: Recheck levels Bone density testing: Not done 5. Labs: Check today 6. Smoking: Never smoked 7. NSAIDs and Antibiotics use: None   Follow up in 6 months   Arlyss Repress, MD

## 2022-08-04 LAB — COMPREHENSIVE METABOLIC PANEL
ALT: 16 IU/L (ref 0–44)
AST: 18 IU/L (ref 0–40)
Albumin/Globulin Ratio: 1 — ABNORMAL LOW (ref 1.2–2.2)
Albumin: 3.9 g/dL — ABNORMAL LOW (ref 4.1–5.1)
Alkaline Phosphatase: 148 IU/L — ABNORMAL HIGH (ref 44–121)
BUN/Creatinine Ratio: 12 (ref 9–20)
BUN: 14 mg/dL (ref 6–24)
Bilirubin Total: 0.3 mg/dL (ref 0.0–1.2)
CO2: 26 mmol/L (ref 20–29)
Calcium: 8.9 mg/dL (ref 8.7–10.2)
Chloride: 101 mmol/L (ref 96–106)
Creatinine, Ser: 1.16 mg/dL (ref 0.76–1.27)
Globulin, Total: 3.9 g/dL (ref 1.5–4.5)
Glucose: 63 mg/dL — ABNORMAL LOW (ref 70–99)
Potassium: 4.6 mmol/L (ref 3.5–5.2)
Sodium: 139 mmol/L (ref 134–144)
Total Protein: 7.8 g/dL (ref 6.0–8.5)
eGFR: 78 mL/min/{1.73_m2} (ref >59)

## 2022-08-04 LAB — IRON,TIBC AND FERRITIN PANEL
Ferritin: 70 ng/mL (ref 30–400)
Iron Saturation: 25 % (ref 15–55)
Iron: 71 ug/dL (ref 38–169)
Total Iron Binding Capacity: 285 ug/dL (ref 250–450)
UIBC: 214 ug/dL (ref 111–343)

## 2022-08-04 LAB — VITAMIN D 25 HYDROXY (VIT D DEFICIENCY, FRACTURES): Vit D, 25-Hydroxy: 22.8 ng/mL — ABNORMAL LOW (ref 30.0–100.0)

## 2022-08-04 LAB — CBC
Hematocrit: 38 % (ref 37.5–51.0)
Hemoglobin: 12.8 g/dL — ABNORMAL LOW (ref 13.0–17.7)
MCH: 31.8 pg (ref 26.6–33.0)
MCHC: 33.7 g/dL (ref 31.5–35.7)
MCV: 94 fL (ref 79–97)
Platelets: 234 10*3/uL (ref 150–450)
RBC: 4.03 x10E6/uL — ABNORMAL LOW (ref 4.14–5.80)
RDW: 12.5 % (ref 11.6–15.4)
WBC: 7.5 10*3/uL (ref 3.4–10.8)

## 2022-08-05 ENCOUNTER — Telehealth: Payer: Self-pay

## 2022-08-05 DIAGNOSIS — R7989 Other specified abnormal findings of blood chemistry: Secondary | ICD-10-CM

## 2022-08-05 MED ORDER — VITAMIN D (ERGOCALCIFEROL) 1.25 MG (50000 UNIT) PO CAPS: 50000.0000 [IU] | ORAL_CAPSULE | ORAL | 0 refills | Status: DC

## 2022-08-05 NOTE — Telephone Encounter (Signed)
-----   Message from Toney Reil, MD sent at 08/04/2022  6:50 PM EST ----- Please inform patient that he has mild vitamin D deficiency.  Recommend vitamin D 50 K once a week for 8 weeks.  He no longer has iron deficiency anemia.  Also, one of his liver enzymes continue to remain elevated and gradually uptrending.  Recommend to check right upper quadrant ultrasound and antimitochondrial antibodies.   RV

## 2022-08-05 NOTE — Telephone Encounter (Signed)
Sent medication to the pharmacy. Order lab work and ultrasound will call patient to find out when he can come for these

## 2022-08-05 NOTE — Telephone Encounter (Signed)
Patient verbalized understanding of results. He will go pick up the medication at the pharmacy. Asked if it would be easier for him to go to a place down in [place] he states no he wants to come here for it. Called and got patient schedule for 08/22/22 arrive to medical mall at 9:30am for a 10:00am scan. Nothing to eat or drink after midnight. Patient is going to the labs on the same day of his ultrasound

## 2022-08-11 ENCOUNTER — Ambulatory Visit: Payer: BC Managed Care – PPO | Admitting: Certified Registered"

## 2022-08-11 ENCOUNTER — Ambulatory Visit
Admission: RE | Admit: 2022-08-11 | Discharge: 2022-08-11 | Disposition: A | Discharge status: Home / Self Care | Payer: BC Managed Care – PPO | Source: Ambulatory Visit | Attending: Gastroenterology | Admitting: Gastroenterology

## 2022-08-11 ENCOUNTER — Encounter
Admission: RE | Disposition: A | Discharge status: Home / Self Care | Payer: Self-pay | Source: Ambulatory Visit | Attending: Gastroenterology

## 2022-08-11 ENCOUNTER — Encounter: Payer: Self-pay | Admitting: Gastroenterology

## 2022-08-11 DIAGNOSIS — K219 Gastro-esophageal reflux disease without esophagitis: Secondary | ICD-10-CM | POA: Insufficient documentation

## 2022-08-11 DIAGNOSIS — J45909 Unspecified asthma, uncomplicated: Secondary | ICD-10-CM | POA: Diagnosis not present

## 2022-08-11 DIAGNOSIS — K5100 Ulcerative (chronic) pancolitis without complications: Secondary | ICD-10-CM | POA: Diagnosis present

## 2022-08-11 DIAGNOSIS — K6389 Other specified diseases of intestine: Secondary | ICD-10-CM | POA: Diagnosis not present

## 2022-08-11 DIAGNOSIS — K51011 Ulcerative (chronic) pancolitis with rectal bleeding: Secondary | ICD-10-CM | POA: Insufficient documentation

## 2022-08-11 HISTORY — PX: COLONOSCOPY WITH PROPOFOL: SHX5780

## 2022-08-11 LAB — CALPROTECTIN, FECAL: Calprotectin, Fecal: 741 ug/g — ABNORMAL HIGH (ref 0–120)

## 2022-08-11 SURGERY — COLONOSCOPY WITH PROPOFOL
Anesthesia: General

## 2022-08-11 MED ORDER — LIDOCAINE HCL (CARDIAC) PF 100 MG/5ML IV SOSY
PREFILLED_SYRINGE | INTRAVENOUS | Status: DC | PRN
  Administered 2022-08-11: 50 mg via INTRAVENOUS

## 2022-08-11 MED ORDER — PROPOFOL 1000 MG/100ML IV EMUL
INTRAVENOUS | Status: AC
  Filled 2022-08-11: qty 100

## 2022-08-11 MED ORDER — PROPOFOL 500 MG/50ML IV EMUL
INTRAVENOUS | Status: DC | PRN
  Administered 2022-08-11: 150 ug/kg/min via INTRAVENOUS

## 2022-08-11 MED ORDER — SODIUM CHLORIDE 0.9 % IV SOLN: INTRAVENOUS | Status: DC

## 2022-08-11 MED ORDER — PROPOFOL 10 MG/ML IV BOLUS
INTRAVENOUS | Status: DC | PRN
  Administered 2022-08-11: 100 mg via INTRAVENOUS

## 2022-08-11 NOTE — Anesthesia Postprocedure Evaluation (Signed)
Anesthesia Post Note  Patient: ANCHOR DWAN  Procedure(s) Performed: COLONOSCOPY WITH PROPOFOL  Patient location during evaluation: Endoscopy Anesthesia Type: General Level of consciousness: awake and alert Pain management: pain level controlled Vital Signs Assessment: post-procedure vital signs reviewed and stable Respiratory status: spontaneous breathing, nonlabored ventilation, respiratory function stable and patient connected to nasal cannula oxygen Cardiovascular status: blood pressure returned to baseline and stable Postop Assessment: no apparent nausea or vomiting Anesthetic complications: no   No notable events documented.   Last Vitals:  Vitals:   08/11/22 1331 08/11/22 1458  BP: 127/87 123/68  Pulse: 65 85  Resp: 18 10  Temp: (!) 36.3 C (!) 36.1 C  SpO2: 100% 100%    Last Pain:  Vitals:   08/11/22 1458  TempSrc: Temporal  PainSc: Asleep                 Louie Boston

## 2022-08-11 NOTE — Anesthesia Procedure Notes (Signed)
Procedure Name: MAC Date/Time: 08/11/2022 2:34 PM  Performed by: Biagio Borg, CRNAPre-anesthesia Checklist: Patient identified, Emergency Drugs available, Suction available, Patient being monitored and Timeout performed Patient Re-evaluated:Patient Re-evaluated prior to induction Oxygen Delivery Method: Nasal cannula Induction Type: IV induction Placement Confirmation: positive ETCO2 and CO2 detector

## 2022-08-11 NOTE — Op Note (Signed)
[facility] Patient Name: Ian Washington Procedure Date: 08/11/2022 2:30 PM MRN: [id number] Account #: [id number] Date of Birth: 1976/05/24 Admit Type: Outpatient Age: 46 Room: [facility] ENDO ROOM 3 Gender: Male Note Status: Finalized Instrument Name: Colonoscope [id number] Procedure:             Colonoscopy Indications:           Last colonoscopy: July 2022, Disease activity                         assessment of chronic ulcerative pancolitis, Assess                         therapeutic response to therapy of chronic ulcerative                         pancolitis Providers:             Toney Reil MD, MD Referring MD:          No Local Md, MD (Referring MD) Medicines:             General Anesthesia Complications:         No immediate complications. Estimated blood loss: None. Procedure:             Pre-Anesthesia Assessment:                        - Prior to the procedure, a History and Physical was                         performed, and patient medications and allergies were                         reviewed. The patient is competent. The risks and                         benefits of the procedure and the sedation options and                         risks were discussed with the patient. All questions                         were answered and informed consent was obtained.                         Patient identification and proposed procedure were                         verified by the physician, the nurse, the                         anesthesiologist, the anesthetist and the technician                         in the pre-procedure area in the procedure room in the                         endoscopy suite. Mental Status Examination: alert and  oriented. Airway Examination: normal oropharyngeal                         airway and neck mobility. Respiratory Examination:                         clear to auscultation. CV Examination:  normal.                         Prophylactic Antibiotics: The patient does not require                         prophylactic antibiotics. Prior Anticoagulants: The                         patient has taken no anticoagulant or antiplatelet                         agents. ASA Grade Assessment: III - A patient with                         severe systemic disease. After reviewing the risks and                         benefits, the patient was deemed in satisfactory                         condition to undergo the procedure. The anesthesia                         plan was to use general anesthesia. Immediately prior                         to administration of medications, the patient was                         re-assessed for adequacy to receive sedatives. The                         heart rate, respiratory rate, oxygen saturations,                         blood pressure, adequacy of pulmonary ventilation, and                         response to care were monitored throughout the                         procedure. The physical status of the patient was                         re-assessed after the procedure.                        After obtaining informed consent, the colonoscope was                         passed under direct vision. Throughout the procedure,  the patient's blood pressure, pulse, and oxygen                         saturations were monitored continuously. The                         Colonoscope was introduced through the anus and                         advanced to the the terminal ileum, with                         identification of the appendiceal orifice and IC                         valve. The colonoscopy was performed without                         difficulty. The patient tolerated the procedure well.                         The quality of the bowel preparation was good. The                         terminal ileum, ileocecal valve, appendiceal  orifice,                         and rectum were photographed. Findings:      The perianal and digital rectal examinations were normal. Pertinent       negatives include normal sphincter tone and no palpable rectal lesions.      The terminal ileum appeared normal.      Normal mucosa was found in the sigmoid colon, in the transverse colon,       in the ascending colon and in the cecum. Biopsies were taken with a cold       forceps for histology. Estimated blood loss: none.      Inflammation was found in a continuous and circumferential pattern less       severe in the rectum compared to rectosigmoid and in the descending       colon. This was graded as Mayo Score 1 (mild, with erythema, decreased       vascular pattern, mild friability). Biopsies were taken with a cold       forceps for histology.      The retroflexed view of the distal rectum and anal verge was normal and       showed no anal or rectal abnormalities. Impression:            - The examined portion of the ileum was normal.                        - Normal mucosa in the sigmoid colon, in the                         transverse colon, in the ascending colon and in the                         cecum. Biopsied.                        -  Mild (Mayo Score 1) left-sided ulcerative colitis.                         Biopsied.                        - The distal rectum and anal verge are normal on                         retroflexion view. Recommendation:        - Discharge patient to home (with escort).                        - Resume previous diet today.                        - Continue present medications.                        - Await pathology results.                        - Return to my office as previously scheduled. Procedure Code(s):     --- Professional ---                        [phone], Colonoscopy, flexible; with biopsy, single or                         multiple Diagnosis Code(s):     --- Professional ---                         K51.50, Left sided colitis without complications                        K51.00, Ulcerative (chronic) pancolitis without                         complications CPT copyright 2022 American Medical Association. All rights reserved. The codes documented in this report are preliminary and upon coder review may  be revised to meet current compliance requirements. Dr. Libby Maw Toney Reil MD, MD 08/11/2022 2:59:05 PM This report has been signed electronically. Number of Addenda: 0 Note Initiated On: 08/11/2022 2:30 PM Scope Withdrawal Time: 0 hours 14 minutes 9 seconds  Total Procedure Duration: 0 hours 17 minutes 10 seconds  Estimated Blood Loss:  Estimated blood loss: none.      [facility]

## 2022-08-11 NOTE — Transfer of Care (Signed)
Immediate Anesthesia Transfer of Care Note  Patient: Ian Washington  Procedure(s) Performed: COLONOSCOPY WITH PROPOFOL  Patient Location: PACU and Endoscopy Unit  Anesthesia Type:General  Level of Consciousness: sedated  Airway & Oxygen Therapy: Patient Spontanous Breathing  Post-op Assessment: Report given to RN and Post -op Vital signs reviewed and stable  Post vital signs: Reviewed and stable  Last Vitals:  Vitals Value Taken Time  BP 123/68 08/11/22 1458  Temp 36.1 C 08/11/22 1458  Pulse 85 08/11/22 1458  Resp 10 08/11/22 1458  SpO2 100 % 08/11/22 1458    Last Pain:  Vitals:   08/11/22 1458  TempSrc: Temporal  PainSc: Asleep         Complications: No notable events documented.

## 2022-08-11 NOTE — Anesthesia Preprocedure Evaluation (Signed)
Anesthesia Evaluation  Patient identified by MRN, date of birth, ID band Patient awake    Reviewed: Allergy & Precautions, NPO status , Patient's Chart, lab work & pertinent test results  History of Anesthesia Complications Negative for: history of anesthetic complications  Airway Mallampati: III  TM Distance: >3 FB Neck ROM: full    Dental  (+) Chipped   Pulmonary neg shortness of breath, asthma    Pulmonary exam normal        Cardiovascular Exercise Tolerance: Good (-) Past MI negative cardio ROS Normal cardiovascular exam     Neuro/Psych negative neurological ROS  negative psych ROS   GI/Hepatic Neg liver ROS, PUD,GERD  Controlled,,  Endo/Other  negative endocrine ROS    Renal/GU negative Renal ROS  negative genitourinary   Musculoskeletal   Abdominal   Peds  Hematology negative hematology ROS (+)   Anesthesia Other Findings Past Medical History: No date: Asthma No date: Rectal bleeding No date: UC (ulcerative colitis) ([facility])  Past Surgical History: 2011: CHOLECYSTECTOMY 03/18/2021: COLONOSCOPY WITH PROPOFOL; N/A     Comment:  Procedure: COLONOSCOPY WITH PROPOFOL;  Surgeon: Toney Reil, MD;  Location: [facility] ENDOSCOPY;  Service:               Gastroenterology;  Laterality: N/A; 03/18/2021: ESOPHAGOGASTRODUODENOSCOPY (EGD) WITH PROPOFOL; N/A     Comment:  Procedure: ESOPHAGOGASTRODUODENOSCOPY (EGD) WITH               PROPOFOL;  Surgeon: Toney Reil, MD;  Location:               [facility] ENDOSCOPY;  Service: Gastroenterology;  Laterality:               N/A;  BMI    Body Mass Index: 28.35 kg/m      Reproductive/Obstetrics negative OB ROS                             Anesthesia Physical Anesthesia Plan  ASA: 3  Anesthesia Plan: General   Post-op Pain Management:    Induction: Intravenous  PONV Risk Score and Plan: Propofol infusion and  TIVA  Airway Management Planned: Natural Airway and Nasal Cannula  Additional Equipment:   Intra-op Plan:   Post-operative Plan:   Informed Consent: I have reviewed the patients History and Physical, chart, labs and discussed the procedure including the risks, benefits and alternatives for the proposed anesthesia with the patient or authorized representative who has indicated his/her understanding and acceptance.     Dental Advisory Given  Plan Discussed with: Anesthesiologist, CRNA and Surgeon  Anesthesia Plan Comments: (Patient consented for risks of anesthesia including but not limited to:  - adverse reactions to medications - risk of airway placement if required - damage to eyes, teeth, lips or other oral mucosa - nerve damage due to positioning  - sore throat or hoarseness - Damage to heart, brain, nerves, lungs, other parts of body or loss of life  Patient voiced understanding.)       Anesthesia Quick Evaluation

## 2022-08-11 NOTE — H&P (Signed)
Arlyss Repress, MD [address]  [address]  [place], [place] [address]  Main: [phone]  Fax: [phone] Pager: [phone]  Primary Care Physician:  Patient, No Pcp Per Primary Gastroenterologist:  Dr. Arlyss Repress  Pre-Procedure History & Physical: HPI:  Ian Washington is a 46 y.o. male is here for an colonoscopy.   Past Medical History:  Diagnosis Date   Asthma    Rectal bleeding    UC (ulcerative colitis) ([facility])     Past Surgical History:  Procedure Laterality Date   CHOLECYSTECTOMY  2011   COLONOSCOPY WITH PROPOFOL N/A 03/18/2021   Procedure: COLONOSCOPY WITH PROPOFOL;  Surgeon: Toney Reil, MD;  Location[facility] ENDOSCOPY;  Service: Gastroenterology;  Laterality: N/A;   ESOPHAGOGASTRODUODENOSCOPY (EGD) WITH PROPOFOL N/A 03/18/2021   Procedure: ESOPHAGOGASTRODUODENOSCOPY (EGD) WITH PROPOFOL;  Surgeon: Toney Reil, MD;  Location: [facility] ENDOSCOPY;  Service: Gastroenterology;  Laterality: N/A;    Prior to Admission medications   Medication Sig Start Date End Date Taking? Authorizing Provider  albuterol (VENTOLIN HFA) 108 (90 Base) MCG/ACT inhaler Inhale into the lungs. 07/18/22 08/17/22  [provider]  atorvastatin (LIPITOR) 10 MG tablet Take 10 mg by mouth daily.    [provider]  HUMIRA PEN 40 MG/0.4ML PNKT INJECT 40MG  SUBCUTANEOUSLY EVERY OTHER WEEK 08/01/22   Volanda Mangine, 08/03/22, MD  sildenafil (VIAGRA) 50 MG tablet Take 50 mg by mouth daily as needed. 01/13/21   [provider]  Vitamin D, Ergocalciferol, (DRISDOL) 1.25 MG (50000 UNIT) CAPS capsule Take 1 capsule (50,000 Units total) by mouth every 7 (seven) days. 08/05/22   14/1/23, MD    Allergies as of 08/03/2022 - Review Complete 08/03/2022  Allergen Reaction Noted   Acai extract Hives 02/25/2022    Family History  Problem Relation Age of Onset   Pancreatic cancer Mother    Diabetes Mother    Diabetes Maternal Grandmother    Breast cancer  Maternal Aunt     Social History   Socioeconomic History   Marital status: Married    Spouse name: Not on file   Number of children: Not on file   Years of education: Not on file   Highest education level: Not on file  Occupational History   Not on file  Tobacco Use   Smoking status: Never   Smokeless tobacco: Never  Vaping Use   Vaping Use: Never used  Substance and Sexual Activity   Alcohol use: No   Drug use: No   Sexual activity: Not on file  Other Topics Concern   Not on file  Social History Narrative   Works as 02/27/2022    Married    One child 3 y./o    Social Determinants of Education officer, environmental Strain: Not on file  Food Insecurity: Not on file  Transportation Needs: Not on file  Physical Activity: Not on file  Stress: Not on file  Social Connections: Not on file  Intimate Partner Violence: Not on file    Review of Systems: See HPI, otherwise negative ROS  Physical Exam: BP 127/87   Pulse 65   Temp (!) 97.4 F (36.3 C) (Temporal)   Resp 18   Ht 5\' 9"  (1.753 m)   Wt 87.1 kg   SpO2 100%   BMI 28.35 kg/m  General:   Alert,  pleasant and cooperative in NAD Head:  Normocephalic and atraumatic. Neck:  Supple; no masses or thyromegaly. Lungs:  Clear throughout to auscultation.  Heart:  Regular rate and rhythm. Abdomen:  Soft, nontender and nondistended. Normal bowel sounds, without guarding, and without rebound.   Neurologic:  Alert and  oriented x4;  grossly normal neurologically.  Impression/Plan: Ian Washington is here for an colonoscopy to be performed for Ulcerative pancolitis   Risks, benefits, limitations, and alternatives regarding  colonoscopy have been reviewed with the patient.  Questions have been answered.  All parties agreeable.   Lannette Donath, MD  08/11/2022, 2:02 PM

## 2022-08-12 ENCOUNTER — Encounter: Payer: Self-pay | Admitting: Gastroenterology

## 2022-08-15 LAB — SURGICAL PATHOLOGY

## 2022-08-16 ENCOUNTER — Encounter: Payer: Self-pay | Admitting: Gastroenterology

## 2022-08-16 ENCOUNTER — Telehealth: Payer: Self-pay

## 2022-08-16 DIAGNOSIS — K51011 Ulcerative (chronic) pancolitis with rectal bleeding: Secondary | ICD-10-CM

## 2022-08-16 MED ORDER — HUMIRA (2 PEN) 40 MG/0.4ML ~~LOC~~ AJKT: AUTO-INJECTOR | SUBCUTANEOUS | 2 refills | Status: DC

## 2022-08-16 NOTE — Telephone Encounter (Signed)
-----   Message from Toney Reil, MD sent at 08/16/2022 11:54 AM EST ----- Regarding: Increase Humira Morrie Sheldon  Please update his Humira prescription to weekly because of ongoing chronic active ulcerative colitis with elevated fecal calprotectin levels. Check adalimumab levels and antibodies. Patient agreed with above plan and please move up his appointment to 3 months, okay to [employer] with virtual visit  RV

## 2022-08-16 NOTE — Telephone Encounter (Signed)
Order labs and sent Humira to [facility] speciality. Made follow up appointment for 11/16/2022 at 3:30pm

## 2022-08-16 NOTE — Telephone Encounter (Signed)
Patient states next infusion is on Friday. Patient is going to try to come for lab work on the 08/18/2022. Told him when his follow up is and he states he will be out of [place] that day. Moved him to 11/20/2021. He states he needs to reschedule his ultrasound. Gave him the number to reschedule it

## 2022-08-22 ENCOUNTER — Ambulatory Visit: Admission: RE | Admit: 2022-08-22 | Payer: BC Managed Care – PPO | Source: Ambulatory Visit

## 2022-08-23 ENCOUNTER — Telehealth: Payer: Self-pay

## 2022-08-23 NOTE — Telephone Encounter (Signed)
Per Raiford Noble with [facility]    I am seeing patient is approved for dose escalation of weekly dosing. :)  We are calling him for shipment.  His last fill was 11/30.

## 2022-08-28 LAB — SERIAL MONITORING

## 2022-08-29 LAB — ADALIMUMAB+AB (SERIAL MONITOR)
Adalimumab Drug Level: 3.2 ug/mL
Anti-Adalimumab Antibody: <25 ng/mL

## 2022-08-29 LAB — MITOCHONDRIAL ANTIBODIES: Mitochondrial Ab: <20 Units (ref 0.0–20.0)

## 2022-10-27 ENCOUNTER — Telehealth: Payer: Self-pay

## 2022-10-27 NOTE — Telephone Encounter (Signed)
Received a medical records request for patient to fax [employer] processing all medical records for the past 3 years for patient. There is no medical release form sign from patient. Faxed back to [phone] requesting a medical release form sign and faxed back to [place]

## 2022-11-06 ENCOUNTER — Other Ambulatory Visit: Payer: Self-pay | Admitting: Gastroenterology

## 2022-11-16 ENCOUNTER — Ambulatory Visit: Payer: BC Managed Care – PPO | Admitting: Gastroenterology

## 2022-11-17 ENCOUNTER — Other Ambulatory Visit: Payer: Self-pay

## 2022-11-21 ENCOUNTER — Telehealth: Payer: Self-pay

## 2022-11-21 ENCOUNTER — Ambulatory Visit: Payer: BC Managed Care – PPO | Admitting: Gastroenterology

## 2022-11-21 NOTE — Telephone Encounter (Signed)
Due to illness, no show will not be charged.

## 2022-11-21 NOTE — Telephone Encounter (Signed)
Patient called because he states his wife got sick and he had to keep his son for her. He reschedule his appointment to 12/26/2022. Informed him it would be up to the office manager if a no show fee would be charged

## 2022-12-26 ENCOUNTER — Ambulatory Visit: Payer: BC Managed Care – PPO | Admitting: Gastroenterology

## 2022-12-26 ENCOUNTER — Encounter: Payer: Self-pay | Admitting: Gastroenterology

## 2022-12-26 VITALS — BP 138/82 | HR 72 | Temp 97.8°F | Ht 69.5 in | Wt 202.4 lb

## 2022-12-26 DIAGNOSIS — K5100 Ulcerative (chronic) pancolitis without complications: Secondary | ICD-10-CM | POA: Diagnosis not present

## 2022-12-26 DIAGNOSIS — K51011 Ulcerative (chronic) pancolitis with rectal bleeding: Secondary | ICD-10-CM

## 2022-12-26 NOTE — Progress Notes (Signed)
Arlyss Repress, MD [address]  [address]  [place], [place] [address]  Main: [phone]  Fax: [phone]    Gastroenterology Consultation  Referring Provider:     No ref. provider found Primary Care Physician:  Patient, No Pcp Per Primary Gastroenterologist:  Dr. Arlyss Repress Reason for Consultation: Ulcerative pancolitis        HPI:   Ian Washington is a 47 y.o. male is seen in consultation for 3 management of  Ulcerative pancolitis. Ian Washington is a 47 year old pleasant African-American male who lives in [place], was originally seen by me on 02/17/21 with 3 months history of rectal bleeding associated with constipation and diarrhea, rectal urgency and abdominal cramps. He carries a previous diagnosis of ulcerative colitis more than 20 years ago and was not on any treatment. I repeated colonoscopy which revealed mild to moderate inflammation extending from hepatic flexure to rectum, and biopsies confirmed chronic active colitis. His calprotectin levels were also elevated to 920. He did have iron deficiency anemia.  He received short course of prednisone 40 mg daily and responded well.  Subsequently, started on biweekly Humira monotherapy.  He has been doing very well from ulcerative colitis standpoint.  He reports having 1 formed bowel movement daily.  He denies any GI symptoms today.  Patient tells me that he received yellow fever vaccine before he traveled to [place] while he was on Humira.  He did not inform me about receiving yellow fever vaccine.  He does not smoke or drink alcohol  NSAIDs: None  Antiplts/Anticoagulants/Anti thrombotics: None  GI Procedures: Colonoscopy over 10 years ago, reportedly normal, report not available  Upper endoscopy and colonoscopy 03/18/2021 - Normal duodenal bulb and second portion of the duodenum. - Erythematous mucosa in the gastric body and antrum. Biopsied. - Esophagogastric landmarks identified. - Normal  gastroesophageal junction and esophagus.  - The examined portion of the ileum was normal. Biopsied. - Moderately active (Mayo Score 2) pancolitis ulcerative colitis, worsened since the last examination. Biopsied.  DIAGNOSIS: A. STOMACH; BIOPSY: - MILD CHRONIC GASTRITIS. - NEGATIVE FOR H. PYLORI, INTESTINAL METAPLASIA, DYSPLASIA AND MALIGNANCY.  B. TERMINAL ILEUM, BIOPSY: - NEGATIVE FOR ACTIVE ILEITIS, GRANULOMATA AND DYSPLASIA.  C. COLON, CECUM AND ASCENDING COLON; BIOPSIES: - NEGATIVE FOR ACTIVE MUCOSAL INFLAMMATION, GRANULOMATA AND DYSPLASIA.  D. COLON, TRANSVERSE; BIOPSIES: - ACTIVE MUCOSAL COLITIS. - NEGATIVE FOR DYSPLASIA.  E. 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 - NEGATIVE FOR DYSPLASIA.  F. 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 - NEGATIVE FOR DYSPLASIA.  G.  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 - NEGATIVE FOR DYSPLASIA.   Patient denies family history of GI malignancy, IBD  Past Medical History:  Diagnosis Date   Asthma    Rectal bleeding    UC (ulcerative colitis) (HCC)      Current Outpatient Medications:    Adalimumab (HUMIRA, 2 PEN,) 40 MG/0.4ML PNKT, INJECT 40MG  SUBCUTANEOUSLY  WEEKLY, Disp: 4 each, Rfl: 2   albuterol (VENTOLIN HFA) 108 (90 Base) MCG/ACT inhaler, Inhale into the lungs., Disp: , Rfl:    ammonium lactate (LAC-HYDRIN) 12 % lotion, Apply 1 Application topically daily., Disp: , Rfl:    atorvastatin (LIPITOR) 10 MG tablet, Take 10 mg by mouth daily., Disp: , Rfl:    betamethasone dipropionate (DIPROLENE)  0.05 % ointment, Apply topically., Disp: , Rfl:    clobetasol ointment (TEMOVATE) 0.05 %, Apply topically.,  Disp: , Rfl:    sildenafil (VIAGRA) 50 MG tablet, Take 50 mg by mouth daily as needed., Disp: , Rfl:    Vitamin D, Ergocalciferol, (DRISDOL) 1.25 MG (50000 UNIT) CAPS capsule, Take 1 capsule (50,000 Units total) by mouth every 7 (seven) days., Disp: 8 capsule, Rfl: 0    Family History  Problem Relation Age of Onset   Pancreatic cancer Mother    Diabetes Mother    Diabetes Maternal Grandmother    Breast cancer Maternal Aunt      Social History   Tobacco Use   Smoking status: Never   Smokeless tobacco: Never  Vaping Use   Vaping Use: Never used  Substance Use Topics   Alcohol use: No   Drug use: No    Allergies as of 12/26/2022 - Review Complete 12/26/2022  Allergen Reaction Noted   Acai extract Hives 02/25/2022    Review of Systems:    All systems reviewed and negative except where noted in HPI.   Physical Exam:  BP 138/82 (BP Location: Left Arm, Patient Position: Sitting, Cuff Size: Normal)   Pulse 72   Temp 97.8 F (36.6 C) (Oral)   Ht 5' 9.5" (1.765 m)   Wt 202 lb 6 oz (91.8 kg)   BMI 29.46 kg/m  No LMP for male patient.  General:   Alert,  Well-developed, well-nourished, pleasant and cooperative in NAD Head:  Normocephalic and atraumatic. Eyes:  Sclera clear, no icterus.   Conjunctiva pink. Ears:  Normal auditory acuity. Nose:  No deformity, discharge, or lesions. Mouth:  No deformity or lesions,oropharynx pink & moist. Neck:  Supple; no masses or thyromegaly. Lungs:  Respirations even and unlabored.  Clear throughout to auscultation.   No wheezes, crackles, or rhonchi. No acute distress. Heart:  Regular rate and rhythm; no murmurs, clicks, rubs, or gallops. Abdomen:  Normal bowel sounds. Soft, non-tender and non-distended without masses, hepatosplenomegaly or hernias noted.  No guarding or rebound tenderness.   Rectal: Not performed Msk:  Symmetrical without gross deformities. Good, equal movement & strength bilaterally. Pulses:  Normal pulses  noted. Extremities:  No clubbing or edema.  No cyanosis. Neurologic:  Alert and oriented x3;  grossly normal neurologically. Skin:  Intact without significant lesions or rashes. No jaundice. Psych:  Alert and cooperative. Normal mood and affect.  Imaging Studies: No recent abdominal imaging  Assessment and Plan:   Ian Washington is a 47 y.o. pleasant African-American male with PanUlcerative colitis on Humira biweekly monotherapy initiated in November 2022 currently in clinical remission  Continue Humira monotherapy biweekly Check CBC, CMP, fecal calprotectin levels, vitamin D, iron panel Recommend colonoscopy to assess response to therapy and endoscopy healing  IBD Health Maintenance  1.TB status: QuantiFERON gold negative on 03/18/2021 2. Anemia: Mild iron deficiency anemia, received iron supplements, recheck CBC and iron panel today 3.Immunizations: Hep A and B, serologies were negative.  Patient reports that he received hepatitis A and B vaccines, Influenza up-to-date with annual influenza vaccine, prevnar received, pneumovax received, Varicella unknown, Zoster has not received Shingrix vaccine, sent in prescription to his local pharmacy.  Today, I have discussed with patient against administering live viral vaccines while he is on ongoing immunosuppressive therapy which is Humira.  He is planning another trip to [place] in August 2024 4.Cancer screening I) Colon cancer/dysplasia surveillance: Recommend colonoscopy II) Skin cancer - counseled about annual skin exam  by dermatology and skin protection in summer using sun screen SPF > 50, clothing 5.Bone health Vitamin D status: Recheck levels Bone density testing: Not done 5. Labs: Check today 6. Smoking: Never smoked 7. NSAIDs and Antibiotics use: None   Follow up in 6 months   Arlyss Repress, MD

## 2022-12-27 ENCOUNTER — Encounter: Payer: Self-pay | Admitting: Gastroenterology

## 2022-12-27 DIAGNOSIS — R748 Abnormal levels of other serum enzymes: Secondary | ICD-10-CM

## 2022-12-27 DIAGNOSIS — K5100 Ulcerative (chronic) pancolitis without complications: Secondary | ICD-10-CM

## 2022-12-27 LAB — COMPREHENSIVE METABOLIC PANEL
ALT: 11 IU/L (ref 0–44)
AST: 19 IU/L (ref 0–40)
Albumin/Globulin Ratio: 1.1 — ABNORMAL LOW (ref 1.2–2.2)
Albumin: 4.1 g/dL (ref 4.1–5.1)
Alkaline Phosphatase: 151 IU/L — ABNORMAL HIGH (ref 44–121)
BUN/Creatinine Ratio: 14 (ref 9–20)
BUN: 16 mg/dL (ref 6–24)
Bilirubin Total: 0.3 mg/dL (ref 0.0–1.2)
CO2: 23 mmol/L (ref 20–29)
Calcium: 9.3 mg/dL (ref 8.7–10.2)
Chloride: 102 mmol/L (ref 96–106)
Creatinine, Ser: 1.14 mg/dL (ref 0.76–1.27)
Globulin, Total: 3.6 g/dL (ref 1.5–4.5)
Glucose: 97 mg/dL (ref 70–99)
Potassium: 4.1 mmol/L (ref 3.5–5.2)
Sodium: 141 mmol/L (ref 134–144)
Total Protein: 7.7 g/dL (ref 6.0–8.5)
eGFR: 80 mL/min/{1.73_m2} (ref >59)

## 2022-12-27 LAB — CBC
Hematocrit: 39.7 % (ref 37.5–51.0)
Hemoglobin: 13.7 g/dL (ref 13.0–17.7)
MCH: 32.1 pg (ref 26.6–33.0)
MCHC: 34.5 g/dL (ref 31.5–35.7)
MCV: 93 fL (ref 79–97)
Platelets: 192 10*3/uL (ref 150–450)
RBC: 4.27 x10E6/uL (ref 4.14–5.80)
RDW: 12.5 % (ref 11.6–15.4)
WBC: 7.3 10*3/uL (ref 3.4–10.8)

## 2023-01-04 ENCOUNTER — Ambulatory Visit
Admission: RE | Admit: 2023-01-04 | Discharge: 2023-01-04 | Disposition: A | Discharge status: Home / Self Care | Payer: BC Managed Care – PPO | Source: Ambulatory Visit | Attending: Gastroenterology | Admitting: Gastroenterology

## 2023-01-04 ENCOUNTER — Other Ambulatory Visit: Payer: Self-pay | Admitting: Gastroenterology

## 2023-01-04 DIAGNOSIS — K5100 Ulcerative (chronic) pancolitis without complications: Secondary | ICD-10-CM | POA: Insufficient documentation

## 2023-01-04 DIAGNOSIS — R748 Abnormal levels of other serum enzymes: Secondary | ICD-10-CM

## 2023-01-04 MED ORDER — GADOBUTROL 1 MMOL/ML IV SOLN
9.0000 mL | Freq: Once | INTRAVENOUS | Status: AC | PRN
  Administered 2023-01-04: 9 mL via INTRAVENOUS

## 2023-06-02 ENCOUNTER — Encounter: Payer: Self-pay | Admitting: Gastroenterology

## 2023-06-05 MED ORDER — HUMIRA (2 PEN) 40 MG/0.4ML ~~LOC~~ AJKT: AUTO-INJECTOR | SUBCUTANEOUS | 0 refills | Status: DC

## 2023-06-21 ENCOUNTER — Other Ambulatory Visit: Payer: Self-pay

## 2023-06-26 ENCOUNTER — Other Ambulatory Visit: Payer: Self-pay | Admitting: Gastroenterology

## 2023-06-28 ENCOUNTER — Encounter: Payer: Self-pay | Admitting: Gastroenterology

## 2023-06-28 ENCOUNTER — Ambulatory Visit: Payer: BC Managed Care – PPO | Admitting: Gastroenterology

## 2023-06-28 VITALS — BP 141/97 | HR 85 | Temp 98.1°F | Ht 69.5 in | Wt 190.0 lb

## 2023-06-28 DIAGNOSIS — R748 Abnormal levels of other serum enzymes: Secondary | ICD-10-CM

## 2023-06-28 DIAGNOSIS — K5100 Ulcerative (chronic) pancolitis without complications: Secondary | ICD-10-CM

## 2023-06-28 NOTE — Progress Notes (Signed)
Arlyss Repress, MD [address]  [address]  [place], [place] [address]  Main: [phone]  Fax: [phone]    Gastroenterology Consultation  Referring Provider:     No ref. provider found Primary Care Physician:  Patient, No Pcp Per Primary Gastroenterologist:  Dr. Arlyss Repress Reason for Consultation: Ulcerative pancolitis        HPI:   Ian Washington is a 47 y.o. male is seen in consultation for 3 management of  Ulcerative pancolitis. Ian Washington is a 47 year old pleasant African-American male who lives in [place], was originally seen by me on 02/17/21 with 3 months history of rectal bleeding associated with constipation and diarrhea, rectal urgency and abdominal cramps. He carries a previous diagnosis of ulcerative colitis more than 20 years ago and was not on any treatment. I repeated colonoscopy which revealed mild to moderate inflammation extending from hepatic flexure to rectum, and biopsies confirmed chronic active colitis. His calprotectin levels were also elevated to 920. He did have iron deficiency anemia.  He received short course of prednisone 40 mg daily and responded well.  Subsequently, started on biweekly Humira monotherapy.  He has been doing very well from ulcerative colitis standpoint.  He reports having 1 formed bowel movement daily.  He denies any GI symptoms today.  Patient tells me that he received yellow fever vaccine before he traveled to [place] while he was on Humira.  He did not inform me about receiving yellow fever vaccine.  Follow-up visit 12/26/2022 Mr. Ian Washington is here for follow-up of ulcerative colitis.  Since last visit, patient underwent reassessment of ulcerative colitis, his fecal calprotectin levels remained elevated, subsequently colonoscopy in 08/2022 revealed moderate to marked left-sided colitis.  His adalimumab trough levels were subtherapeutic.  Therefore, advised to increase to weekly Humira.  Apparently, today patient  tells me that he was talking only once a month injection instead of biweekly injection prior to colonoscopy and since January 2024, he has been taking biweekly dose.  He also states that he has extra doses left in his refrigerator because he has been receiving more shipments within last 4 months.  Otherwise, patient denies any GI symptoms.  Follow-up visit 06/28/2023 Mr. Ian Washington is here for follow-up of ulcerative colitis.  He is doing clinically well with regards to his colitis.  Reports having satisfactory bowel movement about twice a week.  He is on Ozempic for weight loss and lost about 10 pounds within last 6 months.  He also received Shingrix booster dose.  He takes Humira weekly.  He also underwent MRCP which was unremarkable.  He does not smoke or drink alcohol  NSAIDs: None  Antiplts/Anticoagulants/Anti thrombotics: None  GI Procedures: Colonoscopy over 10 years ago, reportedly normal, report not available  Upper endoscopy and colonoscopy 03/18/2021 - Normal duodenal bulb and second portion of the duodenum. - Erythematous mucosa in the gastric body and antrum. Biopsied. - Esophagogastric landmarks identified. - Normal gastroesophageal junction and esophagus.  - The examined portion of the ileum was normal. Biopsied. - Moderately active (Mayo Score 2) pancolitis ulcerative colitis, worsened since the last examination. Biopsied.  DIAGNOSIS: A. STOMACH; BIOPSY: - MILD CHRONIC GASTRITIS. - NEGATIVE FOR H. PYLORI, INTESTINAL METAPLASIA, DYSPLASIA AND MALIGNANCY.  B. TERMINAL ILEUM, BIOPSY: - NEGATIVE FOR ACTIVE ILEITIS, GRANULOMATA AND DYSPLASIA.  C. COLON, CECUM AND ASCENDING COLON; BIOPSIES: - NEGATIVE FOR ACTIVE MUCOSAL INFLAMMATION, GRANULOMATA AND DYSPLASIA.  D. COLON, TRANSVERSE; BIOPSIES: - ACTIVE MUCOSAL COLITIS. - NEGATIVE FOR DYSPLASIA.  E. 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 - NEGATIVE FOR DYSPLASIA.  F. 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 - NEGATIVE FOR DYSPLASIA.  G.  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 - NEGATIVE FOR DYSPLASIA.  Colonoscopy 08/11/2022 DIAGNOSIS: A.  COLON, CECUM; BIOPSIES: - COLONIC MUCOSA WITH FOCAL VERY MILD CHRONIC ACTIVE COLITIS. - MILD MELANOSIS COLI - NO DYSPLASIA.  B.  COLON, ASCENDING; BIOPSIES: - COLONIC MUCOSA WITH SMALL LYMPHOID AGGREGATES. - FEATURES OF A SPECIFIC COLITIS ARE NOT SEEN. - NO DYSPLASIA.  C.  COLON, TRANSVERSE; BIOPSIES: - COLONIC MUCOSA WITH SMALL LYMPHOID AGGREGATES. - FEATURES OF A SPECIFIC COLITIS ARE NOT SEEN. - NO DYSPLASIA.  D.  COLON, DESCENDING; BIOPSIES: - MODERATE TO FOCALLY MARKED CHRONIC ACTIVE COLITIS. - NO DYSPLASIA.  E.  COLON, SIGMOID; BIOPSIES: - FOCAL MODERATE CHRONIC ACTIVE COLITIS. - NO DYSPLASIA.  F.  COLON, RECTOSIGMOIDRECTUM; BIOPSIES: - MODERATE TO FOCALLY MARKED CHRONIC ACTIVE COLITIS. - NO DYSPLASIA.    Patient denies family history of GI malignancy, IBD  Past Medical History:  Diagnosis Date   Asthma    Iron deficiency anemia due to chronic blood loss    Rectal bleeding    UC (ulcerative colitis) (HCC)      Current Outpatient Medications:    adalimumab (HUMIRA, 2 PEN,) 40 MG/0.4ML pen, INJECT 1 PEN SUBCUTANEOUSLY  WEEKLY, Disp: 4 each, Rfl: 0   clindamycin (CLEOCIN T) 1 % lotion, Apply topically., Disp: , Rfl:    mupirocin ointment (BACTROBAN) 2 %, Apply 1 Application topically 3 (three) times daily., Disp: , Rfl:    sildenafil (VIAGRA) 50 MG tablet, Take 50 mg by mouth daily as needed., Disp: , Rfl:     Family History  Problem Relation Age of Onset   Pancreatic cancer Mother    Diabetes Mother     Diabetes Maternal Grandmother    Breast cancer Maternal Aunt      Social History   Tobacco Use   Smoking status: Never   Smokeless tobacco: Never  Vaping Use   Vaping status: Never Used  Substance Use Topics   Alcohol use: No   Drug use: No    Allergies as of 06/28/2023 - Review Complete 06/28/2023  Allergen Reaction Noted   Acai extract Hives 02/25/2022    Review of Systems:    All systems reviewed and negative except where noted in HPI.   Physical Exam:  BP (!) 141/97 (BP Location: Left Arm, Patient Position: Sitting, Cuff Size: Normal)   Pulse 85   Temp 98.1 F (36.7 C) (Oral)   Ht 5' 9.5" (1.765 m)   Wt 190 lb (86.2 kg)   BMI 27.66 kg/m  No LMP for male patient.  General:   Alert,  Well-developed, well-nourished, pleasant and cooperative in NAD Head:  Normocephalic and atraumatic. Eyes:  Sclera clear, no icterus.   Conjunctiva pink. Ears:  Normal auditory acuity. Nose:  No deformity, discharge, or lesions. Mouth:  No deformity or lesions,oropharynx pink & moist. Neck:  Supple; no masses or thyromegaly. Lungs:  Respirations even and unlabored.  Clear throughout to auscultation.   No wheezes, crackles, or rhonchi. No acute distress. Heart:  Regular rate and rhythm; no murmurs, clicks, rubs, or gallops. Abdomen:  Normal bowel sounds. Soft, non-tender and non-distended without masses, hepatosplenomegaly or hernias  noted.  No guarding or rebound tenderness.   Rectal: Not performed Msk:  Symmetrical without gross deformities. Good, equal movement & strength bilaterally. Pulses:  Normal pulses noted. Extremities:  No clubbing or edema.  No cyanosis. Neurologic:  Alert and oriented x3;  grossly normal neurologically. Skin:  Intact without significant lesions or rashes. No jaundice. Psych:  Alert and cooperative. Normal mood and affect.  Imaging Studies: No recent abdominal imaging  Assessment and Plan:   TRAVONN ALEXIS is a 47 y.o. pleasant African-American  male with PanUlcerative colitis on Humira biweekly monotherapy initiated in November 2022, currently in clinical remission  Ulcerative pancolitis Currently in clinical remission Patient was taking Humira once a month until December 2023, subtherapeutic Humira trough levels from 08/2022 were based on monthly dosing.  Currently taking Humira monotherapy weekly.   Recommend to recheck Humira trough levels today ReCheck CBC, CMP today and fecal calprotectin levels   IBD Health Maintenance  1.TB status: QuantiFERON gold negative on 03/18/2021 2. Anemia: Had mild iron deficiency anemia, serum ferritin levels normal, recheck CBC today 3.Immunizations: Hep A and B, serologies were negative.  Patient reports that he received hepatitis A and B vaccines, Influenza up-to-date with annual influenza vaccine, prevnar received, pneumovax received, Varicella unknown, Zoster received Shingrix vaccine first dose sometime in February 2024, received booster as well.  4.Cancer screening I) Colon cancer/dysplasia surveillance: No evidence of dysplasia II) Skin cancer - counseled about annual skin exam by dermatology and skin protection in summer using sun screen SPF > 50, clothing 5.Bone health Vitamin D status: Mild vitamin D deficiency, recommend vitamin D supplements daily Bone density testing: Not done 5. Labs: Check today 6. Smoking: Never smoked 7. NSAIDs and Antibiotics use: None  Elevated alkaline phosphatase levels, elevated since 2018 and continues to rise gradually to date Normal GGT in 6/22 Antimitochondrial antibodies negative MRCP was unremarkable   Follow up in 6 months or annually   Arlyss Repress, MD

## 2023-07-13 LAB — CBC WITH DIFFERENTIAL/PLATELET
Basophils Absolute: 0 10*3/uL (ref 0.0–0.2)
Basos: 0 %
EOS (ABSOLUTE): 0.2 10*3/uL (ref 0.0–0.4)
Eos: 3 %
Hematocrit: 39.3 % (ref 37.5–51.0)
Hemoglobin: 13.3 g/dL (ref 13.0–17.7)
Immature Grans (Abs): 0 10*3/uL (ref 0.0–0.1)
Immature Granulocytes: 0 %
Lymphocytes Absolute: 3.7 10*3/uL — ABNORMAL HIGH (ref 0.7–3.1)
Lymphs: 49 %
MCH: 31.7 pg (ref 26.6–33.0)
MCHC: 33.8 g/dL (ref 31.5–35.7)
MCV: 94 fL (ref 79–97)
Monocytes Absolute: 0.5 10*3/uL (ref 0.1–0.9)
Monocytes: 6 %
Neutrophils Absolute: 3.2 10*3/uL (ref 1.4–7.0)
Neutrophils: 42 %
Platelets: 196 10*3/uL (ref 150–450)
RBC: 4.2 x10E6/uL (ref 4.14–5.80)
RDW: 12.4 % (ref 11.6–15.4)
WBC: 7.7 10*3/uL (ref 3.4–10.8)

## 2023-07-13 LAB — COMPREHENSIVE METABOLIC PANEL
ALT: 11 IU/L (ref 0–44)
AST: 20 [IU]/L (ref 0–40)
Albumin: 4.1 g/dL (ref 4.1–5.1)
Alkaline Phosphatase: 130 [IU]/L — ABNORMAL HIGH (ref 44–121)
BUN/Creatinine Ratio: 15 (ref 9–20)
BUN: 16 mg/dL (ref 6–24)
Bilirubin Total: 0.4 mg/dL (ref 0.0–1.2)
CO2: 25 mmol/L (ref 20–29)
Calcium: 9 mg/dL (ref 8.7–10.2)
Chloride: 101 mmol/L (ref 96–106)
Creatinine, Ser: 1.09 mg/dL (ref 0.76–1.27)
Globulin, Total: 3.8 g/dL (ref 1.5–4.5)
Glucose: 83 mg/dL (ref 70–99)
Potassium: 4.2 mmol/L (ref 3.5–5.2)
Sodium: 139 mmol/L (ref 134–144)
Total Protein: 7.9 g/dL (ref 6.0–8.5)
eGFR: 84 mL/min/{1.73_m2} (ref >59)

## 2023-07-13 LAB — ADALIMUMAB+AB (SERIAL MONITOR)
Adalimumab Drug Level: 4.9 ug/mL
Anti-Adalimumab Antibody: <25 ng/mL

## 2023-07-25 ENCOUNTER — Telehealth: Payer: Self-pay

## 2023-07-25 NOTE — Telephone Encounter (Signed)
Submitted PA through cover my meds for weekly Humira. Waiting on response from insurance company

## 2023-07-27 NOTE — Telephone Encounter (Signed)
Request Reference Number: [id number]. HUMIRA PEN INJ 40/0.4ML is approved through 07/24/2024. Your patient may now fill this prescription and it will be covered.. Authorization Expiration Date: July 24, 2024.

## 2023-08-07 ENCOUNTER — Telehealth: Payer: Self-pay

## 2023-08-07 MED ORDER — AMJEVITA 40 MG/0.4ML ~~LOC~~ SOSY: 40.0000 mg | PREFILLED_SYRINGE | SUBCUTANEOUS | 5 refills | Status: DC

## 2023-08-07 NOTE — Telephone Encounter (Signed)
Sent the change the speciality pharmacy. Called and informed patient and he verbalized understanding

## 2023-08-07 NOTE — Telephone Encounter (Signed)
Insurance starting 09/06/2023 are not going to cover brand name Humira. They are wanting [place] to switch to Amjevita. Please let me know if this is okay?

## 2023-08-16 ENCOUNTER — Other Ambulatory Visit: Payer: Self-pay | Admitting: Gastroenterology

## 2023-08-16 NOTE — Telephone Encounter (Signed)
Last office visit 06/28/2023 Ulcerative Pancolitis  Last refill

## 2023-09-06 ENCOUNTER — Other Ambulatory Visit: Payer: Self-pay | Admitting: Gastroenterology

## 2024-01-08 ENCOUNTER — Telehealth: Payer: Self-pay | Admitting: Gastroenterology

## 2024-01-08 NOTE — Telephone Encounter (Signed)
 The patient called requesting a medication refill for Humira . When asked which pharmacy he uses, he was unsure, stating that the medication is delivered by mail.

## 2024-01-17 ENCOUNTER — Telehealth: Payer: Self-pay

## 2024-01-17 ENCOUNTER — Other Ambulatory Visit: Payer: Self-pay | Admitting: Gastroenterology

## 2024-01-17 MED ORDER — AMJEVITA 40 MG/0.4ML ~~LOC~~ SOSY: 40.0000 mg | PREFILLED_SYRINGE | SUBCUTANEOUS | 2 refills | Status: AC

## 2024-01-17 MED ORDER — HUMIRA (2 PEN) 40 MG/0.4ML ~~LOC~~ AJKT: 40.0000 mg | AUTO-INJECTOR | SUBCUTANEOUS | 2 refills | Status: DC

## 2024-01-17 NOTE — Telephone Encounter (Signed)
 Please advise if okay to refill... Last seen 06/2023... Please advise

## 2024-01-17 NOTE — Telephone Encounter (Signed)
-----   Message from [facility] sent at 01/17/2024  7:55 AM EDT ----- He is taking Amjevita  which is humira  biosimilar, and needs prior auth, ok to refill for 3 months  RV
